# Patient Record
Sex: Male | Born: 1940
Health system: Southern US, Community
[De-identification: ages and names within clinical notes are randomized; demographics above are authoritative.]

## PROBLEM LIST (undated history)

## (undated) DIAGNOSIS — N400 Enlarged prostate without lower urinary tract symptoms: Secondary | ICD-10-CM

## (undated) DIAGNOSIS — T7840XA Allergy, unspecified, initial encounter: Secondary | ICD-10-CM

## (undated) DIAGNOSIS — N200 Calculus of kidney: Secondary | ICD-10-CM

## (undated) DIAGNOSIS — J189 Pneumonia, unspecified organism: Secondary | ICD-10-CM

## (undated) DIAGNOSIS — H269 Unspecified cataract: Secondary | ICD-10-CM

## (undated) DIAGNOSIS — Z87442 Personal history of urinary calculi: Secondary | ICD-10-CM

## (undated) DIAGNOSIS — H9190 Unspecified hearing loss, unspecified ear: Secondary | ICD-10-CM

## (undated) DIAGNOSIS — E785 Hyperlipidemia, unspecified: Secondary | ICD-10-CM

## (undated) HISTORY — DX: Allergy, unspecified, initial encounter: T78.40XA

## (undated) HISTORY — DX: Unspecified cataract: H26.9

## (undated) HISTORY — DX: Hyperlipidemia, unspecified: E78.5

## (undated) HISTORY — PX: LITHOTRIPSY: SUR834

## (undated) HISTORY — PX: TONSILLECTOMY: SUR1361

## (undated) HISTORY — PX: COLONOSCOPY: SHX174

---

## 2004-02-22 HISTORY — PX: LASER OF PROSTATE W/ GREEN LIGHT PVP: SHX1953

## 2010-11-13 ENCOUNTER — Emergency Department: Payer: Self-pay | Admitting: Emergency Medicine

## 2012-09-06 ENCOUNTER — Emergency Department: Payer: Self-pay | Admitting: Emergency Medicine

## 2012-09-06 LAB — URINALYSIS, COMPLETE
Glucose,UR: NEGATIVE mg/dL (ref 0–75)
Leukocyte Esterase: NEGATIVE
Nitrite: NEGATIVE
RBC,UR: 91 /HPF (ref 0–5)
WBC UR: 5 /HPF (ref 0–5)

## 2012-09-06 LAB — CBC WITH DIFFERENTIAL/PLATELET
Basophil #: 0 10*3/uL (ref 0.0–0.1)
Eosinophil #: 0 10*3/uL (ref 0.0–0.7)
Lymphocyte #: 0.8 10*3/uL — ABNORMAL LOW (ref 1.0–3.6)
Lymphocyte %: 10.2 %
MCH: 30.6 pg (ref 26.0–34.0)
MCV: 89 fL (ref 80–100)
Monocyte %: 9.5 %
Neutrophil #: 6.5 10*3/uL (ref 1.4–6.5)
Neutrophil %: 79.3 %
Platelet: 174 10*3/uL (ref 150–440)
RBC: 4.74 10*6/uL (ref 4.40–5.90)
RDW: 14.2 % (ref 11.5–14.5)
WBC: 8.2 10*3/uL (ref 3.8–10.6)

## 2012-09-06 LAB — COMPREHENSIVE METABOLIC PANEL
Albumin: 3.1 g/dL — ABNORMAL LOW (ref 3.4–5.0)
Alkaline Phosphatase: 105 U/L (ref 50–136)
BUN: 28 mg/dL — ABNORMAL HIGH (ref 7–18)
Bilirubin,Total: 0.5 mg/dL (ref 0.2–1.0)
Calcium, Total: 8.6 mg/dL (ref 8.5–10.1)
Chloride: 108 mmol/L — ABNORMAL HIGH (ref 98–107)
Creatinine: 1.58 mg/dL — ABNORMAL HIGH (ref 0.60–1.30)
EGFR (Non-African Amer.): 42 — ABNORMAL LOW
Osmolality: 285 (ref 275–301)
Sodium: 139 mmol/L (ref 136–145)
Total Protein: 7 g/dL (ref 6.4–8.2)

## 2012-09-06 LAB — LIPASE, BLOOD: Lipase: 54 U/L — ABNORMAL LOW (ref 73–393)

## 2012-09-24 ENCOUNTER — Ambulatory Visit: Payer: Self-pay | Admitting: Urology

## 2012-09-27 ENCOUNTER — Ambulatory Visit: Payer: Self-pay | Admitting: Urology

## 2012-10-16 ENCOUNTER — Ambulatory Visit: Payer: Self-pay | Admitting: Urology

## 2014-12-03 ENCOUNTER — Observation Stay
Admission: EM | Admit: 2014-12-03 | Discharge: 2014-12-04 | Disposition: A | Discharge status: Home / Self Care | Payer: Medicare Other | Attending: Surgery | Admitting: Surgery

## 2014-12-03 ENCOUNTER — Observation Stay: Payer: Medicare Other | Admitting: Anesthesiology

## 2014-12-03 ENCOUNTER — Encounter
Admission: EM | Disposition: A | Discharge status: Home / Self Care | Payer: Self-pay | Source: Home / Self Care | Attending: Emergency Medicine

## 2014-12-03 ENCOUNTER — Emergency Department: Payer: Medicare Other

## 2014-12-03 DIAGNOSIS — K625 Hemorrhage of anus and rectum: Secondary | ICD-10-CM | POA: Diagnosis not present

## 2014-12-03 DIAGNOSIS — Y929 Unspecified place or not applicable: Secondary | ICD-10-CM | POA: Insufficient documentation

## 2014-12-03 DIAGNOSIS — T185XXA Foreign body in anus and rectum, initial encounter: Secondary | ICD-10-CM | POA: Diagnosis present

## 2014-12-03 DIAGNOSIS — Y9389 Activity, other specified: Secondary | ICD-10-CM | POA: Insufficient documentation

## 2014-12-03 DIAGNOSIS — Z87442 Personal history of urinary calculi: Secondary | ICD-10-CM | POA: Insufficient documentation

## 2014-12-03 DIAGNOSIS — Z886 Allergy status to analgesic agent status: Secondary | ICD-10-CM | POA: Insufficient documentation

## 2014-12-03 DIAGNOSIS — E7800 Pure hypercholesterolemia, unspecified: Secondary | ICD-10-CM | POA: Insufficient documentation

## 2014-12-03 DIAGNOSIS — Z87891 Personal history of nicotine dependence: Secondary | ICD-10-CM | POA: Insufficient documentation

## 2014-12-03 DIAGNOSIS — X58XXXA Exposure to other specified factors, initial encounter: Secondary | ICD-10-CM | POA: Insufficient documentation

## 2014-12-03 HISTORY — PX: RECTAL EXAM UNDER ANESTHESIA: SHX6399

## 2014-12-03 LAB — BASIC METABOLIC PANEL
ANION GAP: 7 (ref 5–15)
BUN: 23 mg/dL — ABNORMAL HIGH (ref 6–20)
CHLORIDE: 107 mmol/L (ref 101–111)
CO2: 25 mmol/L (ref 22–32)
Calcium: 8.8 mg/dL — ABNORMAL LOW (ref 8.9–10.3)
Creatinine, Ser: 0.97 mg/dL (ref 0.61–1.24)
GFR calc non Af Amer: >60 mL/min (ref >60)
Glucose, Bld: 145 mg/dL — ABNORMAL HIGH (ref 65–99)
POTASSIUM: 4.1 mmol/L (ref 3.5–5.1)
SODIUM: 139 mmol/L (ref 135–145)

## 2014-12-03 LAB — TYPE AND SCREEN
ABO/RH(D): O POS
Antibody Screen: NEGATIVE

## 2014-12-03 LAB — CBC
HCT: 43.1 % (ref 40.0–52.0)
Hemoglobin: 14.4 g/dL (ref 13.0–18.0)
MCH: 30.3 pg (ref 26.0–34.0)
MCHC: 33.5 g/dL (ref 32.0–36.0)
MCV: 90.3 fL (ref 80.0–100.0)
Platelets: 186 10*3/uL (ref 150–440)
RBC: 4.77 MIL/uL (ref 4.40–5.90)
RDW: 14.8 % — ABNORMAL HIGH (ref 11.5–14.5)
WBC: 7.8 10*3/uL (ref 3.8–10.6)

## 2014-12-03 SURGERY — SIGMOIDOSCOPY
Anesthesia: General | Site: Rectum | Wound class: Dirty or Infected

## 2014-12-03 MED ORDER — HYDROCORTISONE ACETATE 25 MG RE SUPP
25.0000 mg | Freq: Two times a day (BID) | RECTAL | Status: DC
  Administered 2014-12-03: 25 mg via RECTAL
  Filled 2014-12-03 (×3): qty 1

## 2014-12-03 MED ORDER — FENTANYL CITRATE (PF) 100 MCG/2ML IJ SOLN: 25.0000 ug | INTRAMUSCULAR | Status: DC | PRN

## 2014-12-03 MED ORDER — ONDANSETRON HCL 4 MG PO TABS: 4.0000 mg | ORAL_TABLET | Freq: Four times a day (QID) | ORAL | Status: DC | PRN

## 2014-12-03 MED ORDER — FENTANYL CITRATE (PF) 100 MCG/2ML IJ SOLN
INTRAMUSCULAR | Status: DC | PRN
  Administered 2014-12-03: 50 ug via INTRAVENOUS
  Administered 2014-12-03: 25 ug via INTRAVENOUS

## 2014-12-03 MED ORDER — LACTATED RINGERS IV SOLN
INTRAVENOUS | Status: DC | PRN
  Administered 2014-12-03: 22:00:00 via INTRAVENOUS

## 2014-12-03 MED ORDER — MORPHINE SULFATE (PF) 4 MG/ML IV SOLN
INTRAVENOUS | Status: AC
  Administered 2014-12-03: 4 mg via INTRAVENOUS
  Filled 2014-12-03: qty 1

## 2014-12-03 MED ORDER — ONDANSETRON HCL 4 MG/2ML IJ SOLN
INTRAMUSCULAR | Status: AC
Start: 2014-12-03 — End: 2014-12-03
  Administered 2014-12-03: 4 mg via INTRAMUSCULAR
  Filled 2014-12-03: qty 2

## 2014-12-03 MED ORDER — HEPARIN SODIUM (PORCINE) 5000 UNIT/ML IJ SOLN: 5000.0000 [IU] | Freq: Three times a day (TID) | INTRAMUSCULAR | Status: DC

## 2014-12-03 MED ORDER — ONDANSETRON HCL 4 MG/2ML IJ SOLN: 4.0000 mg | Freq: Four times a day (QID) | INTRAMUSCULAR | Status: DC | PRN

## 2014-12-03 MED ORDER — STARCH 51 % RE SUPP: 1.0000 | RECTAL | Status: DC | PRN

## 2014-12-03 MED ORDER — MORPHINE SULFATE (PF) 4 MG/ML IV SOLN
4.0000 mg | Freq: Once | INTRAVENOUS | Status: AC
  Administered 2014-12-03: 4 mg via INTRAVENOUS

## 2014-12-03 MED ORDER — OXYCODONE-ACETAMINOPHEN 5-325 MG PO TABS: 1.0000 | ORAL_TABLET | Freq: Four times a day (QID) | ORAL | Status: DC | PRN

## 2014-12-03 MED ORDER — ONDANSETRON HCL 4 MG/2ML IJ SOLN: 4.0000 mg | Freq: Once | INTRAMUSCULAR | Status: DC

## 2014-12-03 MED ORDER — MORPHINE SULFATE (PF) 2 MG/ML IV SOLN
2.0000 mg | INTRAVENOUS | Status: DC | PRN
  Administered 2014-12-03: 2 mg via INTRAVENOUS
  Filled 2014-12-03: qty 1

## 2014-12-03 MED ORDER — ONDANSETRON HCL 4 MG/2ML IJ SOLN
4.0000 mg | Freq: Once | INTRAMUSCULAR | Status: AC
  Administered 2014-12-03: 4 mg via INTRAMUSCULAR

## 2014-12-03 MED ORDER — ONDANSETRON HCL 4 MG/2ML IJ SOLN: 4.0000 mg | Freq: Once | INTRAMUSCULAR | Status: DC | PRN

## 2014-12-03 MED ORDER — PROPOFOL 10 MG/ML IV BOLUS
INTRAVENOUS | Status: DC | PRN
  Administered 2014-12-03: 200 mg via INTRAVENOUS

## 2014-12-03 SURGICAL SUPPLY — 20 items
COVER LIGHT HANDLE STERIS (MISCELLANEOUS) ×8 IMPLANT
COVER MAYO STAND STRL (DRAPES) ×4 IMPLANT
DRAPE LAPAROTOMY 100X77 ABD (DRAPES) ×4 IMPLANT
DRAPE LEGGINS SURG 28X43 STRL (DRAPES) ×4 IMPLANT
DRAPE SHEET LG 3/4 BI-LAMINATE (DRAPES) ×4 IMPLANT
DRAPE TABLE BACK 80X90 (DRAPES) ×4 IMPLANT
GLOVE INDICATOR 8.0 STRL GRN (GLOVE) ×4 IMPLANT
GOWN STRL REUS W/ TWL LRG LVL3 (GOWN DISPOSABLE) ×4 IMPLANT
GOWN STRL REUS W/TWL LRG LVL3 (GOWN DISPOSABLE) ×4
HANDLE YANKAUER SUCT BULB TIP (MISCELLANEOUS) ×4 IMPLANT
KIT RM TURNOVER CYSTO AR (KITS) ×4 IMPLANT
LABEL OR SOLS (LABEL) ×4 IMPLANT
PAD OB MATERNITY 4.3X12.25 (PERSONAL CARE ITEMS) ×4 IMPLANT
SOL PREP PVP 2OZ (MISCELLANEOUS) ×4
SOLUTION PREP PVP 2OZ (MISCELLANEOUS) ×2 IMPLANT
SPONGE XRAY 4X4 16PLY STRL (MISCELLANEOUS) ×4 IMPLANT
SURGILUBE 2OZ TUBE FLIPTOP (MISCELLANEOUS) ×4 IMPLANT
TOWEL OR 17X26 4PK STRL BLUE (TOWEL DISPOSABLE) ×4 IMPLANT
TUBING CONNECTING 10 (TUBING) ×3 IMPLANT
TUBING CONNECTING 10' (TUBING) ×1

## 2014-12-03 NOTE — ED Notes (Signed)
Patient transported to X-ray 

## 2014-12-03 NOTE — H&P (Signed)
Colin SchaumannMark L Smith is an 74 y.o. male.    Chief Complaint: Rectal foreign body  HPI: This a patient who self inserted a rectal foreign body, a dry gourd, around 4:30 this afternoon. He has done this before in fact he had have a glass apple removed from his rectum under anesthesia a 17 years ago. Currently he is experiencing some minor bleeding and pain. No nausea or vomiting. He lives at home with his wife and his wife is in the emergency room waiting area.  Past Medical History  Diagnosis Date  . Kidney stone     History reviewed. No pertinent past surgical history.  History reviewed. No pertinent family history. Social History:  reports that he has quit smoking. He does not have any smokeless tobacco history on file. He reports that he does not drink alcohol. His drug history is not on file.  Allergies:  Allergies  Allergen Reactions  . Ibuprofen Anaphylaxis and Swelling     (Not in a hospital admission)   Review of Systems  Constitutional: Negative.   HENT: Negative.   Eyes: Negative.   Respiratory: Negative.   Cardiovascular: Negative.   Gastrointestinal: Negative.  Negative for vomiting and abdominal pain.  Genitourinary: Negative.   Musculoskeletal: Negative.   Skin: Negative.   Neurological: Negative.   Endo/Heme/Allergies: Negative.   Psychiatric/Behavioral: Negative.      Physical Exam:  BP 184/91 mmHg  Pulse 72  Temp(Src) 98.2 F (36.8 C) (Oral)  Resp 18  Ht 5\' 6"  (1.676 m)  Wt 185 lb (83.915 kg)  BMI 29.87 kg/m2  SpO2 95%  Physical Exam  Constitutional: He is oriented to person, place, and time and well-developed, well-nourished, and in no distress. No distress.  In no acute distress but embarrassed  HENT:  Head: Normocephalic.  Eyes: Pupils are equal, round, and reactive to light. Right eye exhibits no discharge. Left eye exhibits no discharge. No scleral icterus.  Neck: Normal range of motion. Neck supple.  Cardiovascular: Normal rate, regular  rhythm and normal heart sounds.   Pulmonary/Chest: Effort normal and breath sounds normal. No respiratory distress. He has no wheezes.  Abdominal: Soft. He exhibits no distension. There is no tenderness.  Genitourinary:  Rectal exam not performed. Examination under anesthesia will be necessary and therefore GU and rectal exam will be performed at that time.  The emergency room physician described palpable shards of gourd present  Musculoskeletal: He exhibits no edema.  Lymphadenopathy:    He has no cervical adenopathy.  Neurological: He is alert and oriented to person, place, and time.  Skin: Skin is warm and dry.  Psychiatric: Affect normal.  Vitals reviewed.       Results for orders placed or performed during the hospital encounter of 12/03/14 (from the past 48 hour(s))  CBC     Status: Abnormal   Collection Time: 12/03/14  6:19 PM  Result Value Ref Range   WBC 7.8 3.8 - 10.6 K/uL   RBC 4.77 4.40 - 5.90 MIL/uL   Hemoglobin 14.4 13.0 - 18.0 g/dL   HCT 16.143.1 09.640.0 - 04.552.0 %   MCV 90.3 80.0 - 100.0 fL   MCH 30.3 26.0 - 34.0 pg   MCHC 33.5 32.0 - 36.0 g/dL   RDW 40.914.8 (H) 81.111.5 - 91.414.5 %   Platelets 186 150 - 440 K/uL   Dg Abd 1 View  12/03/2014  CLINICAL DATA:  Rectal pain, bleeding today. EXAM: ABDOMEN - 1 VIEW COMPARISON:  10/16/2012 FINDINGS: Nonobstructive bowel gas  pattern. Gas noted within a mildly prominent rectum. No free air organomegaly. No suspicious calcification. No acute bony abnormality. IMPRESSION: No acute findings. Electronically Signed   By: Charlett Nose M.D.   On: 12/03/2014 18:52     Assessment/Plan  Rectal foreign body present since 4:30 this afternoon. It is a dried cord which is broken. He's has experienced some bleeding and some pain and requires examination under anesthesia and extraction likely utilizing a rigid sigmoidoscope. Her graft the rationale for this been discussed with him the risk of bleeding infection recurrence and inability to remove all of  the broken pieces were discussed with him he understood and agreed to proceed.  Also discussed with him the timing of surgery in that other surgeries are scheduled tonight and that he will be placed in order appropriately. He did not seem to be happy with this and did not want to stay overnight. I reminded him of Y would keep him overnight due to general anesthesia related night as well as possible bleeding they cannot be assessed preoperatively.  Lattie Haw, MD, FACS

## 2014-12-03 NOTE — Anesthesia Preprocedure Evaluation (Signed)
Anesthesia Evaluation  Patient identified by MRN, date of birth, ID band Patient awake    Reviewed: Allergy & Precautions, NPO status , Patient's Chart, lab work & pertinent test results  History of Anesthesia Complications Negative for: history of anesthetic complications  Airway Mallampati: II       Dental  (+) Missing   Pulmonary neg pulmonary ROS, former smoker,           Cardiovascular negative cardio ROS       Neuro/Psych negative neurological ROS     GI/Hepatic negative GI ROS, Neg liver ROS,   Endo/Other  negative endocrine ROS  Renal/GU Renal disease (stones)     Musculoskeletal   Abdominal   Peds  Hematology negative hematology ROS (+)   Anesthesia Other Findings   Reproductive/Obstetrics                             Anesthesia Physical Anesthesia Plan  ASA: II and emergent  Anesthesia Plan: General   Post-op Pain Management:    Induction: Intravenous  Airway Management Planned: LMA  Additional Equipment:   Intra-op Plan:   Post-operative Plan:   Informed Consent: I have reviewed the patients History and Physical, chart, labs and discussed the procedure including the risks, benefits and alternatives for the proposed anesthesia with the patient or authorized representative who has indicated his/her understanding and acceptance.     Plan Discussed with:   Anesthesia Plan Comments:         Anesthesia Quick Evaluation

## 2014-12-03 NOTE — Transfer of Care (Signed)
Immediate Anesthesia Transfer of Care Note  Patient: Colin SchaumannMark L Smith  Procedure(s) Performed: Procedure(s): SIGMOIDOSCOPY (N/A) RECTAL EXAM UNDER ANESTHESIA  Patient Location: PACU  Anesthesia Type:General  Level of Consciousness: awake and patient cooperative  Airway & Oxygen Therapy: Patient Spontanous Breathing  Post-op Assessment: Report given to RN and Post -op Vital signs reviewed and stable  Post vital signs: Reviewed and stable  Last Vitals:  Filed Vitals:   12/03/14 2029  BP: 169/97  Pulse: 59  Temp: 36.8 C  Resp: 18    Complications: No apparent anesthesia complications

## 2014-12-03 NOTE — ED Notes (Signed)
Spoke to Dr. Pershing ProudSchaevitz in reguards to patient chief complaint.  Verbal orders recieved.

## 2014-12-03 NOTE — Op Note (Signed)
12/03/2014  10:19 PM  PATIENT:  Colin Smith  74 y.o. male  PRE-OPERATIVE DIAGNOSIS:  Rectal foreign body  POST-OPERATIVE DIAGNOSIS:  Same  PROCEDURE: Examination under anesthesia, retrieval of foreign body, rigid sigmoidoscopy  SURGEON:  Lattie Hawichard E Cooper MD, FACS   ANESTHESIA:  Gen. with LMA   Details of Procedure: This a patient with a history of a rectal foreign body believed to be a dried gourd which has fragmented. Preoperatively we discussed rationale for examination under anesthesia and retrieval and sigmoidoscopy the risks of bleeding and infection and inability to retrieve all the fragments was discussed with him he understood and agreed to proceed no family was present for this discussion.  Patient was identified and brought to the operating room placed under general anesthesia and then into a high lithotomy position. A surgical positive was performed then a digital rectal exam was performed. She had grade 4 hemorrhoids and a palpable large foreign body in the rectal vault it was smooth but had sharp edges on it facing the anus. An attempt at grasping it was futile therefore a GYN tenaculum was utilized to grasp the edges. In so doing it fragmented further into smaller pieces which were retrieved and sent for examination. Ultimately S Ring with a tenaculum and gentle traction and manipulation with finger luxation was performed and allowed for retrieval of the remainder of thegourd intact.  Following removal a follow-up rectal exam was performed digitally and then the sigmoidoscope was placed into the anal vault was minimal bleeding but no active bleeding present on visual inspection. A complete sigmoidoscopy was not performed.  Patient tolerated this procedure well there were no complications he was taken down from lithotomy position and awakened and placed into the hospital bed he was taken recovery room in stable condition to be admitted for observation   Lattie Hawichard E Cooper, MD  FACS

## 2014-12-03 NOTE — Anesthesia Procedure Notes (Signed)
Procedure Name: LMA Insertion Date/Time: 12/03/2014 9:52 PM Performed by: Waldo LaineJUSTIS, Lavi Sheehan Pre-anesthesia Checklist: Patient identified, Emergency Drugs available, Suction available, Patient being monitored and Timeout performed Patient Re-evaluated:Patient Re-evaluated prior to inductionOxygen Delivery Method: Circle system utilized Preoxygenation: Pre-oxygenation with 100% oxygen Intubation Type: IV induction Ventilation: Mask ventilation with difficulty LMA: LMA inserted LMA Size: 4.5

## 2014-12-03 NOTE — Anesthesia Postprocedure Evaluation (Signed)
  Anesthesia Post-op Note  Patient: Arnette SchaumannMark L Minter  Procedure(s) Performed: Procedure(s): SIGMOIDOSCOPY (N/A) RECTAL EXAM UNDER ANESTHESIA  Anesthesia type:General  Patient location: PACU  Post pain: Pain level controlled  Post assessment: Post-op Vital signs reviewed, Patient's Cardiovascular Status Stable, Respiratory Function Stable, Patent Airway and No signs of Nausea or vomiting  Post vital signs: Reviewed and stable  Last Vitals:  Filed Vitals:   12/03/14 2306  BP: 133/60  Pulse: 64  Temp: 36.4 C  Resp: 16    Level of consciousness: awake, alert  and patient cooperative  Complications: No apparent anesthesia complications

## 2014-12-03 NOTE — Discharge Instructions (Signed)
Use suppository twice a day for 5 days Resume normal activity

## 2014-12-03 NOTE — ED Provider Notes (Signed)
[facility] Emergency Department Provider Note  ____________________________________________  Time seen: Approximately 630 PM  I have reviewed the triage vital signs and the nursing notes.   HISTORY  Chief Complaint Trauma   HPI Colin Smith is a 74 y.o. male with a history of high cholesterol who is presenting today after inserting a gourd into his rectum. He said that he inserted the court about 4:00 this afternoon and then the end of it broke leaving a large portion of the Halley in his rectum. He said that this was a dried gourd and likely has sharp edges. He has had a small amount of bright red bleeding from his rectum since this incident. He is experiencing moderate pain at this time to his rectum. He said that he inserted a glass apple about 17 years ago as well.   Past Medical History  Diagnosis Date  . Kidney stone     There are no active problems to display for this patient.   History reviewed. No pertinent past surgical history.  Current Outpatient Rx  Name  Route  Sig  Dispense  Refill  . rosuvastatin (CRESTOR) 10 MG tablet   Oral   Take 10 mg by mouth daily.           Allergies Ibuprofen  History reviewed. No pertinent family history.  Social History Social History  Substance Use Topics  . Smoking status: Former Games developer  . Smokeless tobacco: None  . Alcohol Use: No    Review of Systems Constitutional: No fever/chills Eyes: No visual changes. ENT: No sore throat. Cardiovascular: Denies chest pain. Respiratory: Denies shortness of breath. Gastrointestinal: No abdominal pain.  No nausea, no vomiting.  No diarrhea.  No constipation. Genitourinary: Negative for dysuria. Musculoskeletal: Negative for back pain. Skin: Negative for rash. Neurological: Negative for headaches, focal weakness or numbness.  10-point ROS otherwise negative.  ____________________________________________   PHYSICAL EXAM:  VITAL SIGNS: ED  Triage Vitals  Enc Vitals Group     BP 12/03/14 1812 184/91 mmHg     Pulse Rate 12/03/14 1812 72     Resp 12/03/14 1812 18     Temp 12/03/14 1812 98.2 F (36.8 C)     Temp Source 12/03/14 1812 Oral     SpO2 12/03/14 1812 95 %     Weight 12/03/14 1812 185 lb (83.915 kg)     Height 12/03/14 1812  (1.676 m)     Head Cir --      Peak Flow --      Pain Score --      Pain Loc --      Pain Edu? --      Excl. in GC? --     Constitutional: Alert and oriented. Well appearing and in no acute distress. Eyes: Conjunctivae are normal. PERRL. EOMI. Head: Atraumatic. Nose: No congestion/rhinnorhea. Mouth/Throat: Mucous membranes are moist.   Neck: No stridor.   Cardiovascular: Normal rate, regular rhythm. Grossly normal heart sounds.  Good peripheral circulation. Respiratory: Normal respiratory effort.  No retractions. Lungs CTAB. Gastrointestinal: Soft and nontender. No distention. No abdominal bruits. No CVA tenderness. Rectal exam with multiple non-engorged hemorrhoids. He does have a small amount of bright red blood on his underwear. Rectal exam with palpable sharp edges of the gourd. Musculoskeletal: No lower extremity tenderness nor edema.  No joint effusions. Neurologic:  Normal speech and language. No gross focal neurologic deficits are appreciated. No gait instability. Skin:  Skin is warm, dry and intact.  No rash noted. Psychiatric: Mood and affect are normal. Speech and behavior are normal.  ____________________________________________   LABS (all labs ordered are listed, but only abnormal results are displayed)  Labs Reviewed  CBC  BASIC METABOLIC PANEL  TYPE AND SCREEN   ____________________________________________  EKG   ____________________________________________  RADIOLOGY  Round, air filled structure in the rectum with jagged edge likely representing the foreign body on the x-ray of the  abdomen. ____________________________________________   PROCEDURES    ____________________________________________   INITIAL IMPRESSION / ASSESSMENT AND PLAN / ED COURSE  Pertinent labs & imaging results that were available during my care of the patient were reviewed by me and considered in my medical decision making (see chart for details).  ----------------------------------------- 7:20 PM on 12/03/2014 -----------------------------------------  Called Dr. Excell Seltzerooper because I believe the patient, in order to have this objects safely removed, will need a scope. Dr. Excell Seltzerooper did evaluate the patient and will be taking the patient to the operating room later tonight when there is a room available. Patient will be given pain meds and admitted to the hospital. ____________________________________________   FINAL CLINICAL IMPRESSION(S) / ED DIAGNOSES  Rectal foreign body.    Myrna Blazeravid Matthew Shedric Fredericks, MD 12/03/14 Norberta Keens1921

## 2014-12-04 ENCOUNTER — Encounter: Payer: Self-pay | Admitting: Surgery

## 2014-12-04 LAB — ABO/RH: ABO/RH(D): O POS

## 2014-12-04 MED ORDER — OXYCODONE-ACETAMINOPHEN 5-325 MG PO TABS: 1.0000 | ORAL_TABLET | Freq: Four times a day (QID) | ORAL | Status: DC | PRN

## 2014-12-04 MED ORDER — HYDROCORTISONE ACETATE 25 MG RE SUPP: 25.0000 mg | Freq: Two times a day (BID) | RECTAL | Status: DC

## 2014-12-04 NOTE — Progress Notes (Signed)
Pt voided 100 ml.  Colin Smith

## 2014-12-04 NOTE — Progress Notes (Signed)
Patient discharge teaching given, including activity, diet, follow-up appoints, and medications. Patient verbalized understanding of all discharge instructions. IV access was d/c'd. Vitals are stable. Skin is intact except as charted in most recent assessments. Pt to be escorted out by NT, to be driven home by family.  Emre Stock E Hobbs  

## 2014-12-04 NOTE — Progress Notes (Signed)
Pt admitted to [facility] from ED.  Went to OR for removal of gourd from the rectum per Dr. Excell Seltzerooper. VSS at this time.  No c/o pain. No n/v noted. Foley in place draining clear yellow urine.  Will remove foley at 6am as ordered. Resting quietly at this time.  No bleeding noted.  Will cont. To monitor.

## 2014-12-05 LAB — SURGICAL PATHOLOGY

## 2014-12-10 ENCOUNTER — Ambulatory Visit: Payer: Medicare Other | Admitting: Surgery

## 2016-02-29 ENCOUNTER — Ambulatory Visit (INDEPENDENT_AMBULATORY_CARE_PROVIDER_SITE_OTHER): Payer: Medicare Other | Admitting: Cardiovascular Disease

## 2016-02-29 ENCOUNTER — Encounter: Payer: Self-pay | Admitting: Cardiovascular Disease

## 2016-02-29 ENCOUNTER — Encounter (INDEPENDENT_AMBULATORY_CARE_PROVIDER_SITE_OTHER): Payer: Self-pay

## 2016-02-29 VITALS — BP 140/80 | HR 77 | Ht 66.5 in | Wt 197.8 lb

## 2016-02-29 DIAGNOSIS — M7989 Other specified soft tissue disorders: Secondary | ICD-10-CM

## 2016-02-29 DIAGNOSIS — Z0000 Encounter for general adult medical examination without abnormal findings: Secondary | ICD-10-CM

## 2016-02-29 DIAGNOSIS — Z87891 Personal history of nicotine dependence: Secondary | ICD-10-CM | POA: Diagnosis not present

## 2016-02-29 DIAGNOSIS — E782 Mixed hyperlipidemia: Secondary | ICD-10-CM | POA: Diagnosis not present

## 2016-02-29 DIAGNOSIS — I10 Essential (primary) hypertension: Secondary | ICD-10-CM | POA: Diagnosis not present

## 2016-02-29 NOTE — Patient Instructions (Addendum)
Medication Instructions:   No medication changes made  Please monitor your edema, Call if you would like a different diuretic We would use lasix/furosemide as needed Watch the sodium   Labwork:  No new labs needed  Testing/Procedures:  No further testing at this time  Consider a CT coronary calcium score   I recommend watching educational videos on topics of interest to you at:       www.goemmi.com  Enter code: HEARTCARE    Follow-Up: It was a pleasure seeing you in the office today. Please call us if you have new issues that need to be addressed before your next appt.  [phone]  Your physician wants you to follow-up in: as needed  If you need a refill on your cardiac medications before your next appointment, please call your pharmacy.

## 2016-02-29 NOTE — Progress Notes (Signed)
Cardiology Office Note  Date:  02/29/2016   ID:  Colin SchaumannMark L Smith, DOB 07/20/1940, MRN [id number]  PCP:  Colin Smith, JOHN B, MD   Chief Complaint  Patient presents with  . other     New Patient. wants to establish with Cardiologist for LE edema. Pt c/o bilateral ankle/hand swelling; usually ankles. Reviewed meds with pt verbally.    HPI:  [name] is a 76 year old gentleman with history of hyperlipidemia who presents by self-referral for consultation of his lower extremity edema. Patient's wife has been seen in our clinic before Notes from primary care indicate chronic cough Patient reports prior smoking history, stopped many years ago  He reports that over the summer he noticed some leg swelling Fluid seem to collect around his ankles, seem to get better with activity such as playing tennis. symptoms seem to wax and wane, now getting worse    he reports that he Saw Dr. Dan HumphreysWalker,  primary care physician, who started  himon HCTZ 12.5 daily. He took this for a week or so and developed severe joint pain  He stopped the medication approximately 2 weeks ago symptoms resolved  On today's visit he reports that his edema has resolved   did not seem to come back in the past 2 weeks Wife thinks he drinks a lot He does like to sometimes have salty foods such as follows  Recent lab work discussed with him showing normal CBC, his metabolic panel Total cholesterol 170s    PMH:   has a past medical history of Kidney stone.  PSH:    Past Surgical History:  Procedure Laterality Date  . RECTAL EXAM UNDER ANESTHESIA  12/03/2014   Procedure: RECTAL EXAM UNDER ANESTHESIA;  Surgeon: Lattie Hawichard E Cooper, MD;  Location: [facility] ORS;  Service: General;;    Current Outpatient Prescriptions  Medication Sig Dispense Refill  . acetaminophen (TYLENOL) 500 MG tablet Take 1,000 mg by mouth every 6 (six) hours as needed for mild pain.    . Ascorbic Acid (VITAMIN C) 1000 MG tablet Take 1,000 mg by mouth daily.    .  Cholecalciferol (VITAMIN D3) 3000 UNITS TABS Take 3,000 Units by mouth every other day.    . Omega-3 Fatty Acids (FISH OIL) 1000 MG CAPS Take 1,000 mg by mouth daily.    . rosuvastatin (CRESTOR) 10 MG tablet Take 10 mg by mouth daily.    . vitamin E 400 UNIT capsule Take 400 Units by mouth daily.     No current facility-administered medications for this visit.      Allergies:   Ibuprofen   Social History:  The patient  reports that he has quit smoking. He has never used smokeless tobacco. He reports that he drinks about 0.6 oz of alcohol per week . He reports that he does not use drugs.   Family History:   family history includes Arthritis/Rheumatoid in his mother; Diabetes in his brother and maternal grandmother; Hypertension in his father, mother, and paternal grandmother; Prostate cancer in his father.    Review of Systems: Review of Systems  Constitutional: Negative.   Respiratory: Negative.   Cardiovascular: Positive for leg swelling.  Gastrointestinal: Negative.   Musculoskeletal: Negative.   Neurological: Negative.   Psychiatric/Behavioral: Negative.   All other systems reviewed and are negative.    PHYSICAL EXAM: VS:  BP 140/80 (BP Location: Right Arm, Patient Position: Sitting, Cuff Size: Normal)   Pulse 77   Ht 5' 6.5" (1.689 m)   Wt 197 lb  12 oz (89.7 kg)   BMI 31.44 kg/m  , BMI Body mass index is 31.44 kg/m. GEN: Well nourished, well developed, in no acute distress  HEENT: normal  Neck: no JVD, carotid bruits, or masses Cardiac: RRR; no murmurs, rubs, or gallops,no edema  Respiratory:  clear to auscultation bilaterally, normal work of breathing GI: soft, nontender, nondistended, + BS MS: no deformity or atrophy  Skin: warm and dry, no rash Neuro:  Strength and sensation are intact Psych: euthymic mood, full affect    Recent Labs: No results found for requested labs within last 8760 hours.    Lipid Panel No results found for: CHOL, HDL, LDLCALC,  TRIG    Wt Readings from Last 3 Encounters:  02/29/16 197 lb 12 oz (89.7 kg)  12/03/14 182 lb (82.6 kg)       ASSESSMENT AND PLAN:  Localized swelling of lower extremity On today's visit swelling has resolved Long discussion concerning various types of swelling including diastolic CHF, venous insufficiency, lymphedema.  Potentially may have had component of diastolic CHF in the setting of high salt intake resolved with HCTZ. Recommended if symptoms represent that he call our office If needed could try alternate diuretic if HCTZ is giving him side effects Would likely not need this on a regular basis, only sparingly  Mixed hyperlipidemia Managed by primary care, on Crestor No known coronary artery disease Recent climb in lipids over the past year likely from weight gain  Essential hypertension Blood pressure elevated on today's visit. Recommended she closely monitor blood pressure at home and call our office with numbers If blood pressure runs high, may need additional medication  Smoking history currently not smoking, stopped many years ago   Encounter for health maintenance exam Discussed various surveillance testing such as CT coronary calcium score His wife had this as screening test, he does not want to do the test at this time   Total encounter time more than 45 minutes  Greater than 50% was spent in counseling and coordination of care with the patient   Disposition:   F/U  as needed   No orders of the defined types were placed in this encounter.    Signed, Dossie Arbour, M.D., Ph.D. 02/29/2016  [facility] [place], [place] [phone]

## 2016-03-02 NOTE — Addendum Note (Signed)
Addended by: Juleen StarrBRACKETT, BRITTANY L on: 03/02/2016 01:57 PM   Modules accepted: Orders

## 2016-04-19 ENCOUNTER — Other Ambulatory Visit: Payer: Self-pay | Admitting: Internal Medicine

## 2016-04-19 DIAGNOSIS — N5089 Other specified disorders of the male genital organs: Secondary | ICD-10-CM

## 2016-04-22 ENCOUNTER — Ambulatory Visit
Admission: RE | Admit: 2016-04-22 | Discharge: 2016-04-22 | Disposition: A | Discharge status: Home / Self Care | Payer: Medicare Other | Source: Ambulatory Visit | Attending: Internal Medicine | Admitting: Internal Medicine

## 2016-04-22 DIAGNOSIS — N5089 Other specified disorders of the male genital organs: Secondary | ICD-10-CM

## 2016-04-22 DIAGNOSIS — N509 Disorder of male genital organs, unspecified: Secondary | ICD-10-CM | POA: Diagnosis present

## 2016-04-22 DIAGNOSIS — N503 Cyst of epididymis: Secondary | ICD-10-CM | POA: Diagnosis not present

## 2016-05-25 ENCOUNTER — Other Ambulatory Visit: Payer: Self-pay | Admitting: Student

## 2016-05-25 DIAGNOSIS — M7521 Bicipital tendinitis, right shoulder: Secondary | ICD-10-CM

## 2016-06-06 ENCOUNTER — Ambulatory Visit
Admission: RE | Admit: 2016-06-06 | Discharge: 2016-06-06 | Disposition: A | Discharge status: Home / Self Care | Payer: Medicare Other | Source: Ambulatory Visit | Attending: Student | Admitting: Student

## 2016-06-06 DIAGNOSIS — M19011 Primary osteoarthritis, right shoulder: Secondary | ICD-10-CM | POA: Insufficient documentation

## 2016-06-06 DIAGNOSIS — M75101 Unspecified rotator cuff tear or rupture of right shoulder, not specified as traumatic: Secondary | ICD-10-CM | POA: Insufficient documentation

## 2016-06-06 DIAGNOSIS — M7551 Bursitis of right shoulder: Secondary | ICD-10-CM | POA: Insufficient documentation

## 2016-06-06 DIAGNOSIS — M7521 Bicipital tendinitis, right shoulder: Secondary | ICD-10-CM | POA: Diagnosis present

## 2016-06-06 DIAGNOSIS — M75111 Incomplete rotator cuff tear or rupture of right shoulder, not specified as traumatic: Secondary | ICD-10-CM | POA: Diagnosis not present

## 2016-06-23 ENCOUNTER — Encounter
Admission: RE | Admit: 2016-06-23 | Discharge: 2016-06-23 | Disposition: A | Discharge status: Home / Self Care | Payer: Medicare Other | Source: Ambulatory Visit | Attending: Surgery | Admitting: Surgery

## 2016-06-23 DIAGNOSIS — Z01818 Encounter for other preprocedural examination: Secondary | ICD-10-CM | POA: Diagnosis present

## 2016-06-23 HISTORY — DX: Unspecified hearing loss, unspecified ear: H91.90

## 2016-06-23 HISTORY — DX: Personal history of urinary calculi: Z87.442

## 2016-06-23 LAB — CBC
HEMATOCRIT: 44.2 % (ref 40.0–52.0)
Hemoglobin: 15 g/dL (ref 13.0–18.0)
MCH: 30.4 pg (ref 26.0–34.0)
MCHC: 34 g/dL (ref 32.0–36.0)
MCV: 89.5 fL (ref 80.0–100.0)
PLATELETS: 208 10*3/uL (ref 150–440)
RBC: 4.94 MIL/uL (ref 4.40–5.90)
RDW: 14.8 % — ABNORMAL HIGH (ref 11.5–14.5)
WBC: 6.4 10*3/uL (ref 3.8–10.6)

## 2016-06-23 NOTE — Patient Instructions (Signed)
Your procedure is scheduled on: Jul 07, 2016 (Thursday)  Report to Same Day Surgery 2nd floor medical mall [facility](Medical Mall Entrance-take elevator on left to 2nd floor.  Check in with surgery information desk.) To find out your arrival time please call [phone] between 1PM - 3PM on Jul 06, 2016 (Wednesday)   Remember: Instructions that are not followed completely may result in serious medical risk, up to and including death, or upon the discretion of your surgeon and anesthesiologist your surgery may need to be rescheduled.    _x___ 1. Do not eat food or drink liquids after midnight. No gum chewing or hard candies                          __x__ 2. No Alcohol for 24 hours before or after surgery.   __x__3. No Smoking for 24 prior to surgery.   ____  4. Bring all medications with you on the day of surgery if instructed.    __x__ 5. Notify your doctor if there is any change in your medical condition     (cold, fever, infections).     Do not wear jewelry, make-up, hairpins, clips or nail polish.  Do not wear lotions, powders, or perfumes.   Do not shave 48 hours prior to surgery. Men may shave face and neck.  Do not bring valuables to the hospital.    [facility] is not responsible for any belongings or valuables.               Contacts, dentures or bridgework may not be worn into surgery.  Leave your suitcase in the car. After surgery it may be brought to your room.  For patients admitted to the hospital, discharge time is determined by your treatment team                        Patients discharged the day of surgery will not be allowed to drive home.  You will need someone to drive you home and stay with you the night of your procedure.    Please read over the following fact sheets that you were given:   [facility] Preparing for [facility] and or MRSA Information   _x___ Take anti-hypertensive (unless it includes a diuretic), cardiac, seizure, asthma,     anti-reflux and  psychiatric medicines with a sip of water. These include:  1. CRESTOR   2.  3.  4.  5.  6.  ____Fleets enema or Magnesium Citrate as directed.   _x___ Use CHG Soap or sage wipes as directed on instruction sheet   ____ Use inhalers on the day of surgery and bring to hospital day of surgery  ____ Stop Metformin and Janumet 2 days prior to surgery.    ____ Take 1/2 of usual insulin dose the night before surgery and none on the morning     surgery.   _x___ Follow recommendations from Cardiologist, Pulmonologist or PCP regarding          stopping Aspirin, Coumadin, Pllavix ,Eliquis, Effient, or Pradaxa, and Pletal.  X____Stop Anti-inflammatories such as Advil, Aleve, Ibuprofen, Motrin, Naproxen, Naprosyn, Goodies powders or aspirin products. OK to take Tylenol                           _x___ Stop supplements until after surgery.  But may continue Vitamin D, Vitamin B, and multivitamin. (STOP  VITAMIN C, VITAMIN E, AND OMEGA 3,FISH OIL NOW)       ____ Bring C-Pap to the hospital.

## 2016-07-06 MED ORDER — CEFAZOLIN SODIUM-DEXTROSE 2-4 GM/100ML-% IV SOLN
2.0000 g | Freq: Once | INTRAVENOUS | Status: AC
  Administered 2016-07-07: 2 g via INTRAVENOUS

## 2016-07-07 ENCOUNTER — Ambulatory Visit
Admission: RE | Admit: 2016-07-07 | Discharge: 2016-07-07 | Disposition: A | Discharge status: Home / Self Care | Payer: Medicare Other | Source: Ambulatory Visit | Attending: Surgery | Admitting: Surgery

## 2016-07-07 ENCOUNTER — Encounter
Admission: RE | Disposition: A | Discharge status: Home / Self Care | Payer: Self-pay | Source: Ambulatory Visit | Attending: Surgery

## 2016-07-07 ENCOUNTER — Ambulatory Visit: Payer: Medicare Other | Admitting: Anesthesiology

## 2016-07-07 DIAGNOSIS — Z8249 Family history of ischemic heart disease and other diseases of the circulatory system: Secondary | ICD-10-CM | POA: Insufficient documentation

## 2016-07-07 DIAGNOSIS — Z79899 Other long term (current) drug therapy: Secondary | ICD-10-CM | POA: Diagnosis not present

## 2016-07-07 DIAGNOSIS — M7521 Bicipital tendinitis, right shoulder: Secondary | ICD-10-CM | POA: Diagnosis not present

## 2016-07-07 DIAGNOSIS — M75111 Incomplete rotator cuff tear or rupture of right shoulder, not specified as traumatic: Secondary | ICD-10-CM | POA: Diagnosis present

## 2016-07-07 DIAGNOSIS — M7501 Adhesive capsulitis of right shoulder: Secondary | ICD-10-CM | POA: Diagnosis not present

## 2016-07-07 DIAGNOSIS — Z809 Family history of malignant neoplasm, unspecified: Secondary | ICD-10-CM | POA: Diagnosis not present

## 2016-07-07 DIAGNOSIS — M1990 Unspecified osteoarthritis, unspecified site: Secondary | ICD-10-CM | POA: Insufficient documentation

## 2016-07-07 DIAGNOSIS — I10 Essential (primary) hypertension: Secondary | ICD-10-CM | POA: Insufficient documentation

## 2016-07-07 DIAGNOSIS — Z87891 Personal history of nicotine dependence: Secondary | ICD-10-CM | POA: Insufficient documentation

## 2016-07-07 DIAGNOSIS — Z833 Family history of diabetes mellitus: Secondary | ICD-10-CM | POA: Insufficient documentation

## 2016-07-07 HISTORY — PX: SHOULDER ARTHROSCOPY WITH OPEN ROTATOR CUFF REPAIR: SHX6092

## 2016-07-07 HISTORY — PX: SHOULDER ARTHROSCOPY WITH SUBACROMIAL DECOMPRESSION, ROTATOR CUFF REPAIR AND BICEP TENDON REPAIR: SHX5687

## 2016-07-07 HISTORY — PX: SHOULDER ARTHROSCOPY WITH DISTAL CLAVICLE RESECTION: SHX5675

## 2016-07-07 HISTORY — PX: EXAM UNDER ANESTHESIA WITH MANIPULATION OF SHOULDER: SHX5817

## 2016-07-07 SURGERY — ARTHROSCOPY, SHOULDER WITH REPAIR, ROTATOR CUFF, OPEN
Anesthesia: General | Site: Shoulder | Laterality: Right

## 2016-07-07 MED ORDER — PHENYLEPHRINE HCL 10 MG/ML IJ SOLN
INTRAMUSCULAR | Status: AC
  Filled 2016-07-07: qty 1

## 2016-07-07 MED ORDER — BUPIVACAINE-EPINEPHRINE 0.5% -1:200000 IJ SOLN
INTRAMUSCULAR | Status: DC | PRN
  Administered 2016-07-07: 30 mL

## 2016-07-07 MED ORDER — FENTANYL CITRATE (PF) 100 MCG/2ML IJ SOLN
50.0000 ug | Freq: Once | INTRAMUSCULAR | Status: AC
  Administered 2016-07-07: 50 ug via INTRAVENOUS

## 2016-07-07 MED ORDER — BUPIVACAINE-EPINEPHRINE (PF) 0.5% -1:200000 IJ SOLN
INTRAMUSCULAR | Status: AC
  Filled 2016-07-07: qty 30

## 2016-07-07 MED ORDER — FENTANYL CITRATE (PF) 100 MCG/2ML IJ SOLN
INTRAMUSCULAR | Status: AC
  Filled 2016-07-07: qty 2

## 2016-07-07 MED ORDER — ACETAMINOPHEN 10 MG/ML IV SOLN
INTRAVENOUS | Status: DC | PRN
  Administered 2016-07-07: 1000 mg via INTRAVENOUS

## 2016-07-07 MED ORDER — EPINEPHRINE PF 1 MG/ML IJ SOLN
INTRAMUSCULAR | Status: AC
  Filled 2016-07-07: qty 2

## 2016-07-07 MED ORDER — MIDAZOLAM HCL 2 MG/2ML IJ SOLN
1.0000 mg | Freq: Once | INTRAMUSCULAR | Status: AC
  Administered 2016-07-07: 1 mg via INTRAVENOUS

## 2016-07-07 MED ORDER — ONDANSETRON HCL 4 MG/2ML IJ SOLN
INTRAMUSCULAR | Status: AC
Start: 2016-07-07 — End: 2016-07-07
  Filled 2016-07-07: qty 2

## 2016-07-07 MED ORDER — LIDOCAINE HCL (CARDIAC) 20 MG/ML IV SOLN
INTRAVENOUS | Status: DC | PRN
  Administered 2016-07-07: 100 mg via INTRAVENOUS

## 2016-07-07 MED ORDER — FENTANYL CITRATE (PF) 100 MCG/2ML IJ SOLN
INTRAMUSCULAR | Status: AC
  Administered 2016-07-07: 50 ug via INTRAVENOUS
  Filled 2016-07-07: qty 2

## 2016-07-07 MED ORDER — ONDANSETRON HCL 4 MG/2ML IJ SOLN
INTRAMUSCULAR | Status: DC | PRN
  Administered 2016-07-07: 4 mg via INTRAVENOUS

## 2016-07-07 MED ORDER — FAMOTIDINE 20 MG PO TABS
20.0000 mg | ORAL_TABLET | Freq: Once | ORAL | Status: AC
  Administered 2016-07-07: 20 mg via ORAL

## 2016-07-07 MED ORDER — ROCURONIUM BROMIDE 50 MG/5ML IV SOLN
INTRAVENOUS | Status: AC
  Filled 2016-07-07: qty 1

## 2016-07-07 MED ORDER — PHENYLEPHRINE HCL 10 MG/ML IJ SOLN
INTRAMUSCULAR | Status: DC | PRN
  Administered 2016-07-07: 50 ug via INTRAVENOUS
  Administered 2016-07-07: 100 ug via INTRAVENOUS
  Administered 2016-07-07: 50 ug via INTRAVENOUS

## 2016-07-07 MED ORDER — SUCCINYLCHOLINE CHLORIDE 20 MG/ML IJ SOLN
INTRAMUSCULAR | Status: DC | PRN
  Administered 2016-07-07: 100 mg via INTRAVENOUS

## 2016-07-07 MED ORDER — FAMOTIDINE 20 MG PO TABS
ORAL_TABLET | ORAL | Status: AC
  Administered 2016-07-07: 20 mg via ORAL
  Filled 2016-07-07: qty 1

## 2016-07-07 MED ORDER — MIDAZOLAM HCL 2 MG/2ML IJ SOLN
INTRAMUSCULAR | Status: AC
  Administered 2016-07-07: 1 mg via INTRAVENOUS
  Filled 2016-07-07: qty 2

## 2016-07-07 MED ORDER — SUGAMMADEX SODIUM 200 MG/2ML IV SOLN
INTRAVENOUS | Status: AC
  Filled 2016-07-07: qty 2

## 2016-07-07 MED ORDER — FENTANYL CITRATE (PF) 100 MCG/2ML IJ SOLN: 25.0000 ug | INTRAMUSCULAR | Status: DC | PRN

## 2016-07-07 MED ORDER — LIDOCAINE HCL (PF) 2 % IJ SOLN
INTRAMUSCULAR | Status: AC
  Filled 2016-07-07: qty 2

## 2016-07-07 MED ORDER — CEFAZOLIN SODIUM-DEXTROSE 2-4 GM/100ML-% IV SOLN
INTRAVENOUS | Status: AC
  Filled 2016-07-07: qty 100

## 2016-07-07 MED ORDER — ROPIVACAINE HCL 5 MG/ML IJ SOLN
INTRAMUSCULAR | Status: AC
  Filled 2016-07-07: qty 30

## 2016-07-07 MED ORDER — ROCURONIUM BROMIDE 100 MG/10ML IV SOLN
INTRAVENOUS | Status: DC | PRN
  Administered 2016-07-07: 40 mg via INTRAVENOUS

## 2016-07-07 MED ORDER — SUGAMMADEX SODIUM 200 MG/2ML IV SOLN
INTRAVENOUS | Status: DC | PRN
  Administered 2016-07-07: 200 mg via INTRAVENOUS

## 2016-07-07 MED ORDER — PROPOFOL 10 MG/ML IV BOLUS
INTRAVENOUS | Status: AC
  Filled 2016-07-07: qty 20

## 2016-07-07 MED ORDER — ONDANSETRON HCL 4 MG/2ML IJ SOLN: 4.0000 mg | Freq: Once | INTRAMUSCULAR | Status: DC | PRN

## 2016-07-07 MED ORDER — ACETAMINOPHEN 10 MG/ML IV SOLN
INTRAVENOUS | Status: AC
  Filled 2016-07-07: qty 100

## 2016-07-07 MED ORDER — LACTATED RINGERS IV SOLN
INTRAVENOUS | Status: DC
  Administered 2016-07-07: 1000 mL via INTRAVENOUS

## 2016-07-07 MED ORDER — OXYCODONE HCL 5 MG PO TABS: 5.0000 mg | ORAL_TABLET | ORAL | 0 refills | Status: DC | PRN

## 2016-07-07 MED ORDER — LACTATED RINGERS IV SOLN
INTRAVENOUS | Status: DC | PRN
  Administered 2016-07-07: 2 mL

## 2016-07-07 MED ORDER — DEXAMETHASONE SODIUM PHOSPHATE 10 MG/ML IJ SOLN
INTRAMUSCULAR | Status: DC | PRN
  Administered 2016-07-07: 5 mg via INTRAVENOUS

## 2016-07-07 MED ORDER — SUCCINYLCHOLINE CHLORIDE 20 MG/ML IJ SOLN
INTRAMUSCULAR | Status: AC
  Filled 2016-07-07: qty 1

## 2016-07-07 MED ORDER — PROPOFOL 10 MG/ML IV BOLUS
INTRAVENOUS | Status: DC | PRN
  Administered 2016-07-07: 150 mg via INTRAVENOUS

## 2016-07-07 MED ORDER — LIDOCAINE HCL (PF) 1 % IJ SOLN
INTRAMUSCULAR | Status: DC
Start: 2016-07-07 — End: 2016-07-07
  Filled 2016-07-07: qty 5

## 2016-07-07 MED ORDER — DEXAMETHASONE SODIUM PHOSPHATE 10 MG/ML IJ SOLN
INTRAMUSCULAR | Status: AC
  Filled 2016-07-07: qty 1

## 2016-07-07 MED ORDER — ROPIVACAINE HCL 5 MG/ML IJ SOLN
INTRAMUSCULAR | Status: DC | PRN
  Administered 2016-07-07: 20 mL via EPIDURAL

## 2016-07-07 SURGICAL SUPPLY — 45 items
ANCHOR JUGGERKNOT WTAP NDL 2.9 (Anchor) ×9 IMPLANT
ANCHOR SUT QUATTRO KNTLS 4.5 (Anchor) ×6 IMPLANT
BIT DRILL JUGRKNT W/NDL BIT2.9 (DRILL) ×2 IMPLANT
BLADE FULL RADIUS 3.5 (BLADE) ×3 IMPLANT
BUR ACROMIONIZER 4.0 (BURR) ×3 IMPLANT
CANNULA SHAVER 8MMX76MM (CANNULA) ×3 IMPLANT
CHLORAPREP W/TINT 26ML (MISCELLANEOUS) ×3 IMPLANT
COVER MAYO STAND STRL (DRAPES) ×3 IMPLANT
DRAPE IMP U-DRAPE 54X76 (DRAPES) ×6 IMPLANT
DRILL JUGGERKNOT W/NDL BIT 2.9 (DRILL) ×3
DRSG OPSITE POSTOP 4X8 (GAUZE/BANDAGES/DRESSINGS) ×3 IMPLANT
ELECT REM PT RETURN 9FT ADLT (ELECTROSURGICAL) ×3
ELECTRODE REM PT RTRN 9FT ADLT (ELECTROSURGICAL) ×2 IMPLANT
GAUZE PETRO XEROFOAM 1X8 (MISCELLANEOUS) ×3 IMPLANT
GAUZE SPONGE 4X4 12PLY STRL (GAUZE/BANDAGES/DRESSINGS) ×3 IMPLANT
GLOVE BIO SURGEON STRL SZ7.5 (GLOVE) ×6 IMPLANT
GLOVE BIO SURGEON STRL SZ8 (GLOVE) ×6 IMPLANT
GLOVE BIOGEL PI IND STRL 8 (GLOVE) ×2 IMPLANT
GLOVE BIOGEL PI INDICATOR 8 (GLOVE) ×1
GLOVE INDICATOR 8.0 STRL GRN (GLOVE) ×3 IMPLANT
GOWN STRL REUS W/ TWL LRG LVL3 (GOWN DISPOSABLE) ×2 IMPLANT
GOWN STRL REUS W/ TWL XL LVL3 (GOWN DISPOSABLE) ×2 IMPLANT
GOWN STRL REUS W/TWL LRG LVL3 (GOWN DISPOSABLE) ×1
GOWN STRL REUS W/TWL XL LVL3 (GOWN DISPOSABLE) ×1
GRASPER SUT 15 45D LOW PRO (SUTURE) IMPLANT
IV LACTATED RINGER IRRG 3000ML (IV SOLUTION) ×2
IV LR IRRIG 3000ML ARTHROMATIC (IV SOLUTION) ×4 IMPLANT
MANIFOLD NEPTUNE II (INSTRUMENTS) ×3 IMPLANT
MASK FACE SPIDER DISP (MASK) ×3 IMPLANT
MAT BLUE FLOOR 46X72 FLO (MISCELLANEOUS) ×3 IMPLANT
NDL MAYO CATGUT SZ5 (NEEDLE)
NDL SUT 5 .5 CRC TPR PNT MAYO (NEEDLE) IMPLANT
NEEDLE REVERSE CUT 1/2 CRC (NEEDLE) IMPLANT
PACK ARTHROSCOPY SHOULDER (MISCELLANEOUS) ×3 IMPLANT
SLING ARM LRG DEEP (SOFTGOODS) IMPLANT
SLING ULTRA II LG (MISCELLANEOUS) ×3 IMPLANT
STAPLER SKIN PROX 35W (STAPLE) ×3 IMPLANT
STRAP SAFETY BODY (MISCELLANEOUS) ×3 IMPLANT
SUT ETHIBOND 0 MO6 C/R (SUTURE) ×3 IMPLANT
SUT VIC AB 2-0 CT1 27 (SUTURE) ×2
SUT VIC AB 2-0 CT1 TAPERPNT 27 (SUTURE) ×4 IMPLANT
TAPE MICROFOAM 4IN (TAPE) ×3 IMPLANT
TUBING ARTHRO INFLOW-ONLY STRL (TUBING) ×3 IMPLANT
TUBING CONNECTING 10 (TUBING) ×3 IMPLANT
WAND HAND CNTRL MULTIVAC 90 (MISCELLANEOUS) ×3 IMPLANT

## 2016-07-07 NOTE — Anesthesia Preprocedure Evaluation (Signed)
Anesthesia Evaluation  Patient identified by MRN, date of birth, ID band Patient awake    Reviewed: Allergy & Precautions, NPO status , Patient's Chart, lab work & pertinent test results, reviewed documented beta blocker date and time   Airway Mallampati: III  TM Distance: >3 FB     Dental  (+) Chipped   Pulmonary former smoker,           Cardiovascular hypertension, Pt. on medications      Neuro/Psych    GI/Hepatic   Endo/Other    Renal/GU      Musculoskeletal  (+) Arthritis ,   Abdominal   Peds  Hematology   Anesthesia Other Findings   Reproductive/Obstetrics                             Anesthesia Physical Anesthesia Plan  ASA: III  Anesthesia Plan: General   Post-op Pain Management:    Induction: Intravenous  Airway Management Planned: Oral ETT  Additional Equipment:   Intra-op Plan:   Post-operative Plan:   Informed Consent: I have reviewed the patients History and Physical, chart, labs and discussed the procedure including the risks, benefits and alternatives for the proposed anesthesia with the patient or authorized representative who has indicated his/her understanding and acceptance.     Plan Discussed with: CRNA  Anesthesia Plan Comments:         Anesthesia Quick Evaluation

## 2016-07-07 NOTE — Anesthesia Procedure Notes (Signed)
Procedure Name: Intubation Date/Time: 07/07/2016 7:45 AM Performed by: Irving BurtonBACHICH, Averie Hornbaker Pre-anesthesia Checklist: Patient identified, Emergency Drugs available, Suction available and Patient being monitored Patient Re-evaluated:Patient Re-evaluated prior to inductionOxygen Delivery Method: Circle system utilized Preoxygenation: Pre-oxygenation with 100% oxygen Intubation Type: IV induction Ventilation: Mask ventilation without difficulty Laryngoscope Size: McGraph and 4 Grade View: Grade I Tube type: Oral Tube size: 7.5 mm Number of attempts: 2 Airway Equipment and Method: Stylet and Video-laryngoscopy Placement Confirmation: ETT inserted through vocal cords under direct vision,  positive ETCO2 and breath sounds checked- equal and bilateral Secured at: 21 cm Tube secured with: Tape Dental Injury: Teeth and Oropharynx as per pre-operative assessment  Difficulty Due To: Difficulty was anticipated, Difficult Airway- due to immobile epiglottis and Difficult Airway- due to anterior larynx

## 2016-07-07 NOTE — Anesthesia Post-op Follow-up Note (Cosign Needed)
Anesthesia QCDR form completed.        

## 2016-07-07 NOTE — Op Note (Signed)
07/07/2016  9:21 AM  Patient:   Arnette SchaumannMark L Drury  Pre-Op Diagnosis:   Impingement/tendinopathy with near full-thickness rotator cuff tear, right shoulder.  Post-Op Diagnosis: Impingement/tendinopathy with near full-thickness rotator cuff tear, labral fraying, biceps tendinopathy, and adhesive capsulitis, right shoulder.  Procedure: Limited arthroscopic debridement, arthroscopic subacromial decompression, mini-open rotator cuff repair, mini-open biceps tenodesis, and manipulation under anesthesia, right shoulder.  Anesthesia: General endotracheal with interscalene block placed preoperatively by the anesthesiologist.  Surgeon:   Maryagnes AmosJ. Jeffrey Poggi, MD  Assistant:   Horris LatinoLance McGhee, PA-C  Findings: As above. Prior to manipulation, the patient could be forward flexed to 110, abducted to 100, and, at 90 abduction be externally rotated to 70 and internally rotated to 35. Following manipulation, he could be forward flexed to 145, abducted to 135, and, at 90 of abduction be externally rotated to 90 and internally rotated to 60. There was moderate labral fraying involving the superior portion of the labrum without frank detachment. There was a near full-thickness bursal surface tear involving the mid-insertional fibers of the supraspinatus tendon. The remainder of the rotator cuff was in excellent condition. There was moderate induration of the biceps tendon without actual fraying or tearing. The articular surfaces of the glenoid and humerus both were in excellent condition.  Complications: None  Fluids:   400 cc  Estimated blood loss: 10 cc  Tourniquet time: None  Drains: None  Closure: Staples   Brief clinical note: The patient is a 76 year old male with a history of right shoulder pain. The patient's symptoms have progressed despite medications, activity modification, etc. The patient's history and examination are consistent with impingement/tendinopathy with a  rotator cuff tear. These findings were confirmed by MRI scan. The patient presents at this time for definitive management of these shoulder symptoms.  Procedure: The patient underwent placement of an interscalene block by the anesthesiologist in the preoperative holding area before being brought into the operating room and lain in the supine position. The patient then underwent general endotracheal intubation and anesthesia before being repositioned in the beach chair position using the beach chair positioner. The right shoulder and upper extremity were prepped with ChloraPrep solution before being draped sterilely. Preoperative antibiotics were administered. A timeout was performed to confirm the proper surgical site before the expected portal sites and incision site were injected with 0.5% Sensorcaine with epinephrine. A posterior portal was created and the glenohumeral joint thoroughly inspected with the findings as described above. An anterior portal was created using an outside-in technique. The labrum and rotator cuff were further probed, again confirming the above-noted findings. The labrum was carefully probed and found to be attached to the glenoid circumferentially. The areas of labral fraying were debrided back to stable margins using the full-radius resector. Areas of synovitis also were debrided using the full-radius resector. The ArthroCare wand was inserted and used to release the biceps from its labral attachment, as well as to obtain hemostasis and to "anneal" the labrum superiorly. The instruments were removed from the joint after suctioning the excess fluid.  The camera was repositioned through the posterior portal into the subacromial space. A separate lateral portal was created using an outside-in technique. The 3.5 mm full-radius resector was introduced and used to perform a subtotal bursectomy. The ArthroCare wand was then inserted and used to remove the periosteal tissue off the  undersurface of the anterior third of the acromion as well as to recess the coracoacromial ligament from its attachment along the anterior and lateral margins of the  acromion. The 4.0 mm acromionizing bur was introduced and used to complete the decompression by removing the undersurface of the anterior third of the acromion. The full radius resector was reintroduced to remove any residual bony debris before the ArthroCare wand was reintroduced to obtain hemostasis. The instruments were then removed from the subacromial space after suctioning the excess fluid.  An approximately 4-5 cm incision was made over the anterolateral aspect of the shoulder beginning at the anterolateral corner of the acromion and extending distally in line with the bicipital groove. This incision was carried down through the subcutaneous tissues to expose the deltoid fascia. The raphae between the anterior and middle thirds was identified and this plane developed to provide access into the subacromial space. Additional bursal tissues were debrided sharply using Metzenbaum scissors. The rotator cuff tear was readily identified. The margins were debrided sharply with a #15 blade and the exposed greater tuberosity roughened with a rongeur. The tear was repaired using two Biomet 2.9 mm JuggerKnot anchors. These sutures were then brought back laterally and secured using two [place] QuatroLink anchors to create a two-layer closure. An apparent watertight closure was obtained.  The bicipital groove was identified by palpation and opened for 1-1.5 cm. The biceps tendon stump was retrieved through this defect. The floor of the bicipital groove was roughened with a curet before another Biomet 2.9 mm JuggerKnot anchor was inserted. Both sets of sutures were passed through the biceps tendon and tied securely to effect the tenodesis. The bicipital sheath was reapproximated using two #0 Ethibond interrupted sutures, incorporating the biceps tendon to  further reinforce the tenodesis.  The wound was copiously irrigated with sterile saline solution before the deltoid raphae was reapproximated using 2-0 Vicryl interrupted sutures. The subcutaneous tissues were closed in two layers using 2-0 Vicryl interrupted sutures before the skin was closed using staples. The portal sites also were closed using staples. A sterile bulky dressing was applied to the shoulder before the arm was placed into a shoulder immobilizer. The patient was then awakened, extubated, and returned to the recovery room in satisfactory condition after tolerating the procedure well.

## 2016-07-07 NOTE — Discharge Instructions (Addendum)
Keep dressing dry and intact.  May shower after dressing changed on post-op day #4 (Monday).  Cover staples with Band-Aids after drying off. Apply ice frequently to shoulder. Take oxycodone as prescribed when needed.  May supplement with ES Tylenol if necessary. Keep shoulder immobilizer on at all times except may remove for bathing purposes. Follow-up in 10-14 days or as scheduled.  AMBULATORY SURGERY  DISCHARGE INSTRUCTIONS   1) The drugs that you were given will stay in your system until tomorrow so for the next 24 hours you should not:  A) Drive an automobile B) Make any legal decisions C) Drink any alcoholic beverage   2) You may resume regular meals tomorrow.  Today it is better to start with liquids and gradually work up to solid foods.  You may eat anything you prefer, but it is better to start with liquids, then soup and crackers, and gradually work up to solid foods.   3) Please notify your doctor immediately if you have any unusual bleeding, trouble breathing, redness and pain at the surgery site, drainage, fever, or pain not relieved by medication.    4) Additional Instructions: TAKE A STOOL SOFTENER TWICE A DAY WHILE TAKING NARCOTIC PAIN MEDICINE TO PREVENT CONSTIPATION   Please contact your physician with any problems or Same Day Surgery at [phone], Monday through Friday 6 am to 4 pm, or [place] at [facility] Main number at [phone].

## 2016-07-07 NOTE — Transfer of Care (Signed)
Immediate Anesthesia Transfer of Care Note  Patient: Colin SchaumannMark L Smith  Procedure(s) Performed: Procedure(s) with comments: SHOULDER ARTHROSCOPY WITH OPEN ROTATOR CUFF REPAIR (Right) SHOULDER ARTHROSCOPY WITH SUBACROMIAL DECOMPRESSION, ROTATOR CUFF REPAIR AND BICEP TENDON REPAIR (Right) - Debridement SHOULDER ARTHROSCOPY WITH DISTAL CLAVICLE RESECTION (Right) EXAM UNDER ANESTHESIA WITH MANIPULATION OF SHOULDER (Right)  Patient Location: PACU  Anesthesia Type:General  Level of Consciousness: awake, alert  and oriented  Airway & Oxygen Therapy: Patient connected to face mask oxygen  Post-op Assessment: Post -op Vital signs reviewed and stable  Post vital signs: stable  Last Vitals:  Vitals:   07/07/16 0730 07/07/16 0930  BP:  (!) 146/63  Pulse: 62 62  Resp: 14 17  Temp:  (!) 35.8 C    Last Pain:  Vitals:   07/07/16 0629  TempSrc: Tympanic         Complications: No apparent anesthesia complications

## 2016-07-07 NOTE — H&P (Signed)
Paper H&P to be scanned into permanent record. H&P reviewed and patient re-examined. No changes. 

## 2016-07-07 NOTE — Anesthesia Procedure Notes (Signed)
Anesthesia Regional Block: Interscalene brachial plexus block   Pre-Anesthetic Checklist: ,, timeout performed, Correct Patient, Correct Site, Correct Laterality, Correct Procedure, Correct Position, site marked, Risks and benefits discussed,  Surgical consent,  Pre-op evaluation,  At surgeon's request and post-op pain management   Prep: Betadine       Needles:  Injection technique: Single-shot  Needle Type: Echogenic Stimulator Needle     Needle Length: 5cm  Needle Gauge: 21     Additional Needles:   Procedures: ultrasound guided, nerve stimulator,,,,,,   Nerve Stimulator or Paresthesia:  Response: biceps flexion, 0.8 mA,   Additional Responses:   Narrative:  Injection made incrementally with aspirations every 5 mL.  Performed by: Personally  Anesthesiologist: Berdine AddisonHOMAS, Soledad Budreau  Additional Notes: Functioning IV was confirmed and monitors were applied.  A 50mm 22ga Arrow echogenic stimulator needle was used. Sterile prep and drape,hand hygiene and sterile gloves were used.  Negative aspiration and negative test dose prior to incremental administration of local anesthetic. The patient tolerated the procedure well.  Ultrasound guidance: relevent anatomy identified, needle position confirmed, local anesthetic spread visualized around nerve(s), vascular puncture avoided.  Image printed for medical record. 0.5% ropivicaine.

## 2016-07-07 NOTE — Anesthesia Postprocedure Evaluation (Signed)
Anesthesia Post Note  Patient: Colin SchaumannMark L Smith  Procedure(s) Performed: Procedure(s) (LRB): SHOULDER ARTHROSCOPY WITH OPEN ROTATOR CUFF REPAIR (Right) SHOULDER ARTHROSCOPY WITH SUBACROMIAL DECOMPRESSION, ROTATOR CUFF REPAIR AND BICEP TENDON REPAIR (Right) SHOULDER ARTHROSCOPY WITH DISTAL CLAVICLE RESECTION (Right) EXAM UNDER ANESTHESIA WITH MANIPULATION OF SHOULDER (Right)  Patient location during evaluation: PACU Anesthesia Type: General Level of consciousness: awake and alert Pain management: pain level controlled Vital Signs Assessment: post-procedure vital signs reviewed and stable Respiratory status: spontaneous breathing, nonlabored ventilation, respiratory function stable and patient connected to nasal cannula oxygen Cardiovascular status: blood pressure returned to baseline and stable Postop Assessment: no signs of nausea or vomiting Anesthetic complications: no     Last Vitals:  Vitals:   07/07/16 1019 07/07/16 1054  BP:  (!) 128/54  Pulse: (!) 51 (!) 58  Resp: 16 16  Temp:      Last Pain:  Vitals:   07/07/16 1125  TempSrc:   PainSc: 2                  [name]

## 2016-10-19 ENCOUNTER — Encounter: Payer: Self-pay | Admitting: *Deleted

## 2016-10-27 ENCOUNTER — Ambulatory Visit: Payer: Medicare Other | Admitting: Anesthesiology

## 2016-10-27 ENCOUNTER — Ambulatory Visit
Admission: RE | Admit: 2016-10-27 | Discharge: 2016-10-27 | Disposition: A | Discharge status: Home / Self Care | Payer: Medicare Other | Source: Ambulatory Visit | Attending: Ophthalmology | Admitting: Ophthalmology

## 2016-10-27 ENCOUNTER — Encounter
Admission: RE | Disposition: A | Discharge status: Home / Self Care | Payer: Self-pay | Source: Ambulatory Visit | Attending: Ophthalmology

## 2016-10-27 ENCOUNTER — Encounter: Payer: Self-pay | Admitting: *Deleted

## 2016-10-27 DIAGNOSIS — I10 Essential (primary) hypertension: Secondary | ICD-10-CM | POA: Diagnosis not present

## 2016-10-27 DIAGNOSIS — H2512 Age-related nuclear cataract, left eye: Secondary | ICD-10-CM | POA: Diagnosis not present

## 2016-10-27 DIAGNOSIS — Z87442 Personal history of urinary calculi: Secondary | ICD-10-CM | POA: Insufficient documentation

## 2016-10-27 DIAGNOSIS — Z886 Allergy status to analgesic agent status: Secondary | ICD-10-CM | POA: Insufficient documentation

## 2016-10-27 DIAGNOSIS — M1990 Unspecified osteoarthritis, unspecified site: Secondary | ICD-10-CM | POA: Insufficient documentation

## 2016-10-27 DIAGNOSIS — Z87891 Personal history of nicotine dependence: Secondary | ICD-10-CM | POA: Insufficient documentation

## 2016-10-27 DIAGNOSIS — E7800 Pure hypercholesterolemia, unspecified: Secondary | ICD-10-CM | POA: Insufficient documentation

## 2016-10-27 HISTORY — PX: CATARACT EXTRACTION W/PHACO: SHX586

## 2016-10-27 SURGERY — PHACOEMULSIFICATION, CATARACT, WITH IOL INSERTION
Anesthesia: Monitor Anesthesia Care | Site: Eye | Laterality: Left | Wound class: Clean

## 2016-10-27 MED ORDER — EPINEPHRINE PF 1 MG/ML IJ SOLN
INTRAMUSCULAR | Status: AC
  Filled 2016-10-27: qty 1

## 2016-10-27 MED ORDER — NA CHONDROIT SULF-NA HYALURON 40-17 MG/ML IO SOLN
INTRAOCULAR | Status: AC
  Filled 2016-10-27: qty 1

## 2016-10-27 MED ORDER — MOXIFLOXACIN HCL 0.5 % OP SOLN
OPHTHALMIC | Status: AC
  Filled 2016-10-27: qty 3

## 2016-10-27 MED ORDER — POVIDONE-IODINE 5 % OP SOLN
OPHTHALMIC | Status: DC | PRN
  Administered 2016-10-27: 1 via OPHTHALMIC

## 2016-10-27 MED ORDER — ARMC OPHTHALMIC DILATING DROPS
1.0000 "application " | OPHTHALMIC | Status: AC
  Administered 2016-10-27 (×3): 1 via OPHTHALMIC

## 2016-10-27 MED ORDER — MIDAZOLAM HCL 5 MG/5ML IJ SOLN
INTRAMUSCULAR | Status: DC | PRN
  Administered 2016-10-27 (×2): 1 mg via INTRAVENOUS

## 2016-10-27 MED ORDER — LIDOCAINE HCL (PF) 4 % IJ SOLN
INTRAMUSCULAR | Status: AC
  Filled 2016-10-27: qty 5

## 2016-10-27 MED ORDER — MIDAZOLAM HCL 2 MG/2ML IJ SOLN
INTRAMUSCULAR | Status: AC
  Filled 2016-10-27: qty 2

## 2016-10-27 MED ORDER — LIDOCAINE HCL (PF) 4 % IJ SOLN
INTRAMUSCULAR | Status: DC | PRN
  Administered 2016-10-27: 4 mL via OPHTHALMIC

## 2016-10-27 MED ORDER — POVIDONE-IODINE 5 % OP SOLN
OPHTHALMIC | Status: AC
  Filled 2016-10-27: qty 30

## 2016-10-27 MED ORDER — MOXIFLOXACIN HCL 0.5 % OP SOLN: 1.0000 [drp] | OPHTHALMIC | Status: DC | PRN

## 2016-10-27 MED ORDER — MOXIFLOXACIN HCL 0.5 % OP SOLN
OPHTHALMIC | Status: DC | PRN
  Administered 2016-10-27: 0.2 mL via OPHTHALMIC

## 2016-10-27 MED ORDER — BSS IO SOLN
INTRAOCULAR | Status: DC | PRN
  Administered 2016-10-27: 200 mL via INTRAOCULAR

## 2016-10-27 MED ORDER — ARMC OPHTHALMIC DILATING DROPS
OPHTHALMIC | Status: AC
  Administered 2016-10-27: 1 via OPHTHALMIC
  Filled 2016-10-27: qty 0.4

## 2016-10-27 MED ORDER — SODIUM CHLORIDE 0.9 % IV SOLN
INTRAVENOUS | Status: DC
  Administered 2016-10-27 (×2): via INTRAVENOUS

## 2016-10-27 SURGICAL SUPPLY — 16 items
DISSECTOR HYDRO NUCLEUS 50X22 (MISCELLANEOUS) ×2 IMPLANT
GLOVE BIO SURGEON STRL SZ8 (GLOVE) ×2 IMPLANT
GLOVE BIOGEL M 6.5 STRL (GLOVE) ×2 IMPLANT
GLOVE SURG LX 7.5 STRW (GLOVE) ×1
GLOVE SURG LX STRL 7.5 STRW (GLOVE) ×1 IMPLANT
GOWN STRL REUS W/ TWL LRG LVL3 (GOWN DISPOSABLE) ×2 IMPLANT
GOWN STRL REUS W/TWL LRG LVL3 (GOWN DISPOSABLE) ×2
LABEL CATARACT MEDS ST (LABEL) ×2 IMPLANT
LENS IOL TECNIS ITEC 17.5 (Intraocular Lens) ×2 IMPLANT
PACK CATARACT (MISCELLANEOUS) ×2 IMPLANT
PACK CATARACT KING (MISCELLANEOUS) ×2 IMPLANT
PACK EYE AFTER SURG (MISCELLANEOUS) ×2 IMPLANT
SOL BSS BAG (MISCELLANEOUS) ×2
SOLUTION BSS BAG (MISCELLANEOUS) ×1 IMPLANT
WATER STERILE IRR 250ML POUR (IV SOLUTION) ×2 IMPLANT
WIPE NON LINTING 3.25X3.25 (MISCELLANEOUS) ×2 IMPLANT

## 2016-10-27 NOTE — Transfer of Care (Signed)
Immediate Anesthesia Transfer of Care Note  Patient: Colin Smith  Procedure(s) Performed: Procedure(s) with comments: CATARACT EXTRACTION PHACO AND INTRAOCULAR LENS PLACEMENT (IOC) (Left) - Lot #[id number] H [place]: 00:33.0 AP%:13.2 CDE: 4.37   Patient Location: PACU  Anesthesia Type:MAC  Level of Consciousness: awake, alert , oriented and patient cooperative  Airway & Oxygen Therapy: Patient Spontanous Breathing  Post-op Assessment: Report given to RN, Post -op Vital signs reviewed and stable and Patient moving all extremities X 4  Post vital signs: Reviewed and stable  Last Vitals:  Vitals:   10/27/16 0818 10/27/16 1022  BP: (!) 160/94 (!) 147/68  Pulse: 63   Resp: 16 14  Temp: 36.6 C 36.7 C  SpO2: 98% 99%    Last Pain:  Vitals:   10/27/16 1022  TempSrc: Oral         Complications: No apparent anesthesia complications

## 2016-10-27 NOTE — Discharge Instructions (Signed)
Eye Surgery Discharge Instructions  Expect mild scratchy sensation or mild soreness. DO NOT RUB YOUR EYE!  The day of surgery:  Minimal physical activity, but bed rest is not required  No reading, computer work, or close hand work  No bending, lifting, or straining.  May watch TV  For 24 hours:  No driving, legal decisions, or alcoholic beverages  Safety precautions  Eat anything you prefer: It is better to start with liquids, then soup then solid foods.  _____ Eye patch should be worn until postoperative exam tomorrow.  ____ Solar shield eyeglasses should be worn for comfort in the sunlight/patch while sleeping  Resume all regular medications including aspirin or Coumadin if these were discontinued prior to surgery. You may shower, bathe, shave, or wash your hair. Tylenol may be taken for mild discomfort.  Call your doctor if you experience significant pain, nausea, or vomiting, fever > 101 or other signs of infection. [phone] or [phone] Specific instructions:  Follow-up Information    [place] CraneKing, Bradley Quanah, MD Follow up.   Specialty:  Ophthalmology Why:  September 7 at 9:15am Contact information: [address] [place] [address] [phone]

## 2016-10-27 NOTE — Anesthesia Postprocedure Evaluation (Signed)
Anesthesia Post Note  Patient: Colin Smith  Procedure(s) Performed: Procedure(s) (LRB): CATARACT EXTRACTION PHACO AND INTRAOCULAR LENS PLACEMENT (IOC) (Left)  Patient location during evaluation: PACU Anesthesia Type: MAC Level of consciousness: awake and alert Pain management: pain level controlled Vital Signs Assessment: post-procedure vital signs reviewed and stable Respiratory status: spontaneous breathing, nonlabored ventilation and respiratory function stable Cardiovascular status: stable and blood pressure returned to baseline Anesthetic complications: no     Last Vitals:  Vitals:   10/27/16 0818 10/27/16 1022  BP: (!) 160/94 (!) 147/68  Pulse: 63   Resp: 16 14  Temp: 36.6 C 36.7 C  SpO2: 98% 99%    Last Pain:  Vitals:   10/27/16 1022  TempSrc: Oral                 Silvana Newness A

## 2016-10-27 NOTE — Anesthesia Post-op Follow-up Note (Signed)
Anesthesia QCDR form completed.        

## 2016-10-27 NOTE — Op Note (Signed)
OPERATIVE NOTE  Colin SchaumannMark L Cottrill [id number] 10/27/2016   PREOPERATIVE DIAGNOSIS:  Nuclear sclerotic cataract left eye.  H25.12   POSTOPERATIVE DIAGNOSIS:    Nuclear sclerotic cataract left eye.     PROCEDURE:  Phacoemusification with posterior chamber intraocular lens placement of the left eye   LENS:   Implant Name Type Inv. Item Serial No. Manufacturer Lot No. LRB No. Used  LENS IOL DIOP 17.5 - [id number] Intraocular Lens LENS IOL DIOP 17.5 [id number] AMO   Left 1       PCB00 +17.5   ULTRASOUND TIME: 0 minutes 33 seconds.  CDE 4.37   SURGEON:  Willey BladeBradley Maricus Tanzi, MD, MPH   ANESTHESIA:  Topical with tetracaine drops augmented with 1% preservative-free intracameral lidocaine.  ESTIMATED BLOOD LOSS: <1 mL   COMPLICATIONS:  None.   DESCRIPTION OF PROCEDURE:  The patient was identified in the holding room and transported to the operating room and placed in the supine position under the operating microscope.  The left eye was identified as the operative eye and it was prepped and draped in the usual sterile ophthalmic fashion.   A 1.0 millimeter clear-corneal paracentesis was made at the 5:00 position. 0.5 ml of preservative-free 1% lidocaine with epinephrine was injected into the anterior chamber.  The anterior chamber was filled with Discovisc viscoelastic.  A 2.4 millimeter keratome was used to make a near-clear corneal incision at the 2:00 position.  A curvilinear capsulorrhexis was made with a cystotome and capsulorrhexis forceps.  Balanced salt solution was used to hydrodissect and hydrodelineate the nucleus.   Phacoemulsification was then used in stop and chop fashion to remove the lens nucleus and epinucleus.  The remaining cortex was then removed using the irrigation and aspiration handpiece. Discovisc was then placed into the capsular bag to distend it for lens placement.  A lens was then injected into the capsular bag.  The remaining viscoelastic was aspirated.   Wounds were  hydrated with balanced salt solution.  The anterior chamber was inflated to a physiologic pressure with balanced salt solution.  Intracameral vigamox 0.1 mL undiltued was injected into the eye and a drop placed onto the ocular surface.  No wound leaks were noted.  The patient was taken to the recovery room in stable condition without complications of anesthesia or surgery  Willey BladeBradley Korrina Zern 10/27/2016, 10:19 AM

## 2016-10-27 NOTE — H&P (Signed)
The History and Physical notes are on paper, have been signed, and are to be scanned.   I have examined the patient and there are no changes to the H&P.   Willey BladeBradley King 10/27/2016 9:43 AM

## 2016-10-27 NOTE — Anesthesia Preprocedure Evaluation (Signed)
Anesthesia Evaluation  Patient identified by MRN, date of birth, ID band Patient awake    Reviewed: Allergy & Precautions, NPO status , Patient's Chart, lab work & pertinent test results, reviewed documented beta blocker date and time   Airway Mallampati: III  TM Distance: >3 FB     Dental  (+) Chipped   Pulmonary former smoker,           Cardiovascular hypertension, Pt. on medications      Neuro/Psych    GI/Hepatic   Endo/Other    Renal/GU      Musculoskeletal  (+) Arthritis ,   Abdominal   Peds  Hematology   Anesthesia Other Findings   Reproductive/Obstetrics                             Anesthesia Physical Anesthesia Plan  ASA: III  Anesthesia Plan: MAC   Post-op Pain Management:    Induction:   PONV Risk Score and Plan:   Airway Management Planned:   Additional Equipment:   Intra-op Plan:   Post-operative Plan:   Informed Consent: I have reviewed the patients History and Physical, chart, labs and discussed the procedure including the risks, benefits and alternatives for the proposed anesthesia with the patient or authorized representative who has indicated his/her understanding and acceptance.     Plan Discussed with: CRNA  Anesthesia Plan Comments:         Anesthesia Quick Evaluation

## 2016-11-21 ENCOUNTER — Encounter: Payer: Self-pay | Admitting: *Deleted

## 2016-11-24 ENCOUNTER — Ambulatory Visit: Payer: Medicare Other | Admitting: Anesthesiology

## 2016-11-24 ENCOUNTER — Encounter: Payer: Self-pay | Admitting: *Deleted

## 2016-11-24 ENCOUNTER — Ambulatory Visit
Admission: RE | Admit: 2016-11-24 | Discharge: 2016-11-24 | Disposition: A | Discharge status: Home / Self Care | Payer: Medicare Other | Source: Ambulatory Visit | Attending: Ophthalmology | Admitting: Ophthalmology

## 2016-11-24 ENCOUNTER — Encounter
Admission: RE | Disposition: A | Discharge status: Home / Self Care | Payer: Self-pay | Source: Ambulatory Visit | Attending: Ophthalmology

## 2016-11-24 DIAGNOSIS — H2511 Age-related nuclear cataract, right eye: Secondary | ICD-10-CM | POA: Diagnosis not present

## 2016-11-24 DIAGNOSIS — E7800 Pure hypercholesterolemia, unspecified: Secondary | ICD-10-CM | POA: Diagnosis not present

## 2016-11-24 DIAGNOSIS — Z87891 Personal history of nicotine dependence: Secondary | ICD-10-CM | POA: Diagnosis not present

## 2016-11-24 DIAGNOSIS — I10 Essential (primary) hypertension: Secondary | ICD-10-CM | POA: Insufficient documentation

## 2016-11-24 DIAGNOSIS — Z79899 Other long term (current) drug therapy: Secondary | ICD-10-CM | POA: Insufficient documentation

## 2016-11-24 HISTORY — DX: Pneumonia, unspecified organism: J18.9

## 2016-11-24 HISTORY — DX: Calculus of kidney: N20.0

## 2016-11-24 HISTORY — DX: Benign prostatic hyperplasia without lower urinary tract symptoms: N40.0

## 2016-11-24 HISTORY — PX: CATARACT EXTRACTION W/PHACO: SHX586

## 2016-11-24 SURGERY — PHACOEMULSIFICATION, CATARACT, WITH IOL INSERTION
Anesthesia: Monitor Anesthesia Care | Site: Eye | Laterality: Right | Wound class: Clean

## 2016-11-24 MED ORDER — EPINEPHRINE PF 1 MG/ML IJ SOLN
INTRAMUSCULAR | Status: AC
  Filled 2016-11-24: qty 3

## 2016-11-24 MED ORDER — MIDAZOLAM HCL 2 MG/2ML IJ SOLN
INTRAMUSCULAR | Status: DC | PRN
  Administered 2016-11-24: 2 mg via INTRAVENOUS

## 2016-11-24 MED ORDER — ARMC OPHTHALMIC DILATING DROPS
OPHTHALMIC | Status: AC
  Administered 2016-11-24: 1 via OPHTHALMIC
  Filled 2016-11-24: qty 0.4

## 2016-11-24 MED ORDER — POVIDONE-IODINE 5 % OP SOLN
OPHTHALMIC | Status: AC
  Filled 2016-11-24: qty 150

## 2016-11-24 MED ORDER — MIDAZOLAM HCL 2 MG/2ML IJ SOLN
INTRAMUSCULAR | Status: AC
Start: 2016-11-24 — End: 2016-11-24
  Filled 2016-11-24: qty 2

## 2016-11-24 MED ORDER — SODIUM CHLORIDE 0.9 % IV SOLN
INTRAVENOUS | Status: DC
  Administered 2016-11-24: 09:00:00 via INTRAVENOUS

## 2016-11-24 MED ORDER — NA CHONDROIT SULF-NA HYALURON 40-17 MG/ML IO SOLN
INTRAOCULAR | Status: AC
  Filled 2016-11-24: qty 5

## 2016-11-24 MED ORDER — LIDOCAINE HCL (PF) 4 % IJ SOLN
INTRAMUSCULAR | Status: AC
  Filled 2016-11-24: qty 15

## 2016-11-24 MED ORDER — EPINEPHRINE PF 1 MG/ML IJ SOLN
INTRAMUSCULAR | Status: AC
  Filled 2016-11-24: qty 1

## 2016-11-24 MED ORDER — MOXIFLOXACIN HCL 0.5 % OP SOLN
OPHTHALMIC | Status: DC | PRN
  Administered 2016-11-24: 0.2 mL via OPHTHALMIC

## 2016-11-24 MED ORDER — NA CHONDROIT SULF-NA HYALURON 40-30 MG/ML IO SOLN
INTRAOCULAR | Status: DC | PRN
  Administered 2016-11-24: 1 mL via INTRAOCULAR

## 2016-11-24 MED ORDER — LIDOCAINE HCL (PF) 4 % IJ SOLN
INTRAMUSCULAR | Status: AC
  Filled 2016-11-24: qty 5

## 2016-11-24 MED ORDER — LIDOCAINE HCL (PF) 4 % IJ SOLN
INTRAMUSCULAR | Status: DC | PRN
  Administered 2016-11-24: 1 mL via OPHTHALMIC

## 2016-11-24 MED ORDER — FENTANYL CITRATE (PF) 100 MCG/2ML IJ SOLN
INTRAMUSCULAR | Status: DC | PRN
  Administered 2016-11-24 (×2): 50 ug via INTRAVENOUS

## 2016-11-24 MED ORDER — FENTANYL CITRATE (PF) 100 MCG/2ML IJ SOLN
INTRAMUSCULAR | Status: AC
  Filled 2016-11-24: qty 2

## 2016-11-24 MED ORDER — ARMC OPHTHALMIC DILATING DROPS
1.0000 "application " | OPHTHALMIC | Status: DC
  Administered 2016-11-24 (×2): 1 via OPHTHALMIC

## 2016-11-24 MED ORDER — MOXIFLOXACIN HCL 0.5 % OP SOLN: 1.0000 [drp] | OPHTHALMIC | Status: DC | PRN

## 2016-11-24 MED ORDER — POVIDONE-IODINE 5 % OP SOLN
OPHTHALMIC | Status: DC | PRN
  Administered 2016-11-24: 1 via OPHTHALMIC

## 2016-11-24 MED ORDER — MOXIFLOXACIN HCL 0.5 % OP SOLN
OPHTHALMIC | Status: AC
  Filled 2016-11-24: qty 3

## 2016-11-24 SURGICAL SUPPLY — 16 items
DISSECTOR HYDRO NUCLEUS 50X22 (MISCELLANEOUS) ×3 IMPLANT
GLOVE BIO SURGEON STRL SZ8 (GLOVE) ×6 IMPLANT
GLOVE BIOGEL M 6.5 STRL (GLOVE) ×6 IMPLANT
GLOVE SURG LX 7.5 STRW (GLOVE) ×2
GLOVE SURG LX STRL 7.5 STRW (GLOVE) ×1 IMPLANT
GOWN STRL REUS W/ TWL LRG LVL3 (GOWN DISPOSABLE) ×2 IMPLANT
GOWN STRL REUS W/TWL LRG LVL3 (GOWN DISPOSABLE) ×4
LABEL CATARACT MEDS ST (LABEL) ×3 IMPLANT
LENS IOL TECNIS ITEC 17.0 (Intraocular Lens) ×3 IMPLANT
PACK CATARACT (MISCELLANEOUS) ×3 IMPLANT
PACK CATARACT KING (MISCELLANEOUS) ×3 IMPLANT
PACK EYE AFTER SURG (MISCELLANEOUS) ×3 IMPLANT
SOL BSS BAG (MISCELLANEOUS) ×3
SOLUTION BSS BAG (MISCELLANEOUS) ×1 IMPLANT
WATER STERILE IRR 250ML POUR (IV SOLUTION) ×3 IMPLANT
WIPE NON LINTING 3.25X3.25 (MISCELLANEOUS) ×3 IMPLANT

## 2016-11-24 NOTE — Op Note (Signed)
OPERATIVE NOTE  Colin Smith [id number] 11/24/2016   PREOPERATIVE DIAGNOSIS:  Nuclear sclerotic cataract right eye.  H25.11   POSTOPERATIVE DIAGNOSIS:    Nuclear sclerotic cataract right eye.     PROCEDURE:  Phacoemusification with posterior chamber intraocular lens placement of the right eye   LENS:   Implant Name Type Inv. Item Serial No. Manufacturer Lot No. LRB No. Used  LENS IOL DIOP 17.0 - [id number] Intraocular Lens LENS IOL DIOP 17.0 [id number] AMO   Right 1       PCB00 +17.0   ULTRASOUND TIME: 0 minutes 36.1 seconds.  CDE 2.21   SURGEON:  Willey Blade, MD, MPH  ANESTHESIOLOGIST: Anesthesiologist: Lenard Simmer, MD CRNA: Almeta Monas, CRNA   ANESTHESIA:  Topical with tetracaine drops augmented with 1% preservative-free intracameral lidocaine.  ESTIMATED BLOOD LOSS: less than 1 mL.   COMPLICATIONS:  None.   DESCRIPTION OF PROCEDURE:  The patient was identified in the holding room and transported to the operating room and placed in the supine position under the operating microscope.  The right eye was identified as the operative eye and it was prepped and draped in the usual sterile ophthalmic fashion.   A 1.0 millimeter clear-corneal paracentesis was made at the 10:30 position. 0.5 ml of preservative-free 1% lidocaine with epinephrine was injected into the anterior chamber.  The anterior chamber was filled with Discovisc viscoelastic.  A 2.4 millimeter keratome was used to make a near-clear corneal incision at the 8:00 position.  A curvilinear capsulorrhexis was made with a cystotome and capsulorrhexis forceps.  Balanced salt solution was used to hydrodissect and hydrodelineate the nucleus.   Phacoemulsification was then used in stop and chop fashion to remove the lens nucleus and epinucleus.  The remaining cortex was then removed using the irrigation and aspiration handpiece. Discovisc was then placed into the capsular bag to distend it for lens placement.  A  lens was then injected into the capsular bag.  The remaining viscoelastic was aspirated.   Wounds were hydrated with balanced salt solution.  The anterior chamber was inflated to a physiologic pressure with balanced salt solution.   Intracameral vigamox 0.1 mL undiluted was injected into the eye and a drop placed onto the ocular surface.  No wound leaks were noted.  The patient was taken to the recovery room in stable condition without complications of anesthesia or surgery  Willey Blade 11/24/2016, 10:31 AM

## 2016-11-24 NOTE — Anesthesia Preprocedure Evaluation (Signed)
Anesthesia Evaluation  Patient identified by MRN, date of birth, ID band Patient awake    Reviewed: Allergy & Precautions, NPO status , Patient's Chart, lab work & pertinent test results, reviewed documented beta blocker date and time   Airway Mallampati: III  TM Distance: >3 FB     Dental  (+) Chipped   Pulmonary former smoker,           Cardiovascular hypertension, Pt. on medications      Neuro/Psych    GI/Hepatic   Endo/Other    Renal/GU Renal disease (kidney stones)     Musculoskeletal  (+) Arthritis ,   Abdominal   Peds  Hematology   Anesthesia Other Findings Past Medical History: No date: Arthritis No date: BPH (benign prostatic hyperplasia) No date: History of kidney stones No date: HOH (hard of hearing)     Comment:  Bilateral Hearing Aids No date: Hyperlipidemia No date: Kidney stones No date: Pneumonia     Comment:  in past   Reproductive/Obstetrics                             Anesthesia Physical  Anesthesia Plan  ASA: III  Anesthesia Plan: MAC   Post-op Pain Management:    Induction:   PONV Risk Score and Plan:   Airway Management Planned: Nasal Cannula  Additional Equipment:   Intra-op Plan:   Post-operative Plan:   Informed Consent: I have reviewed the patients History and Physical, chart, labs and discussed the procedure including the risks, benefits and alternatives for the proposed anesthesia with the patient or authorized representative who has indicated his/her understanding and acceptance.     Plan Discussed with: CRNA  Anesthesia Plan Comments:         Anesthesia Quick Evaluation

## 2016-11-24 NOTE — Transfer of Care (Signed)
Immediate Anesthesia Transfer of Care Note  Patient: Colin Smith  Procedure(s) Performed: CATARACT EXTRACTION PHACO AND INTRAOCULAR LENS PLACEMENT (IOC) (Right Eye) 2 Patient Location: PACU  Anesthesia Type:MAC  Level of Consciousness: awake  Airway & Oxygen Therapy: Patient Spontanous Breathing  Post-op Assessment: Report given to RN  Post vital signs: Reviewed and stable  Last Vitals:  Vitals:   11/21/16 1430 11/24/16 0834  BP: 122/69 132/66  Pulse: 64 62  Resp:  18  Temp:  (!) 36.4 C  SpO2:  98%    Last Pain:  Vitals:   11/24/16 0834  TempSrc: Tympanic         Complications: No apparent anesthesia complications

## 2016-11-24 NOTE — Discharge Instructions (Signed)
Eye Surgery Discharge Instructions  Expect mild scratchy sensation or mild soreness. DO NOT RUB YOUR EYE!  The day of surgery:  Minimal physical activity, but bed rest is not required  No reading, computer work, or close hand work  No bending, lifting, or straining.  May watch TV  For 24 hours:  No driving, legal decisions, or alcoholic beverages  Safety precautions  Eat anything you prefer: It is better to start with liquids, then soup then solid foods.  _____ Eye patch should be worn until postoperative exam tomorrow.  ____ Solar shield eyeglasses should be worn for comfort in the sunlight/patch while sleeping  Resume all regular medications including aspirin or Coumadin if these were discontinued prior to surgery. You may shower, bathe, shave, or wash your hair. Tylenol may be taken for mild discomfort.  Call your doctor if you experience significant pain, nausea, or vomiting, fever > 101 or other signs of infection. [phone] or [phone] Specific instructions:  Follow-up Information    [place] Crane, MD Follow up.   Specialty:  Ophthalmology Why:  October 5 at 10:40am in [facility] information: [address] [place] [address] [phone]

## 2016-11-24 NOTE — Anesthesia Postprocedure Evaluation (Signed)
Anesthesia Post Note  Patient: Colin Smith  Procedure(s) Performed: CATARACT EXTRACTION PHACO AND INTRAOCULAR LENS PLACEMENT (IOC) (Right Eye)  Patient location during evaluation: PACU Anesthesia Type: MAC Level of consciousness: awake and alert Pain management: pain level controlled Vital Signs Assessment: post-procedure vital signs reviewed and stable Respiratory status: spontaneous breathing, nonlabored ventilation, respiratory function stable and patient connected to nasal cannula oxygen Cardiovascular status: stable and blood pressure returned to baseline Postop Assessment: no apparent nausea or vomiting Anesthetic complications: no     Last Vitals:  Vitals:   11/24/16 1034 11/24/16 1038  BP: (!) 157/67 125/64  Pulse: 60 (!) 56  Resp: 18 16  Temp: (!) 35.8 C   SpO2: 97%     Last Pain:  Vitals:   11/24/16 0834  TempSrc: Tympanic                 Martha Clan

## 2016-11-24 NOTE — Anesthesia Post-op Follow-up Note (Signed)
Anesthesia QCDR form completed.        

## 2016-11-24 NOTE — H&P (Signed)
The History and Physical notes are on paper, have been signed, and are to be scanned.   I have examined the patient and there are no changes to the H&P.   Willey Blade 11/24/2016 9:58 AM

## 2016-12-07 ENCOUNTER — Encounter: Payer: Self-pay | Admitting: Family Medicine

## 2016-12-28 ENCOUNTER — Encounter: Payer: Self-pay | Admitting: Family Medicine

## 2016-12-28 ENCOUNTER — Ambulatory Visit: Payer: Medicare Other | Admitting: Internal Medicine

## 2016-12-28 ENCOUNTER — Ambulatory Visit (INDEPENDENT_AMBULATORY_CARE_PROVIDER_SITE_OTHER): Payer: Medicare Other | Admitting: Family Medicine

## 2016-12-28 ENCOUNTER — Telehealth: Payer: Self-pay

## 2016-12-28 VITALS — BP 144/76 | HR 62 | Temp 97.7°F | Ht 66.75 in | Wt 198.8 lb

## 2016-12-28 DIAGNOSIS — I10 Essential (primary) hypertension: Secondary | ICD-10-CM | POA: Diagnosis not present

## 2016-12-28 DIAGNOSIS — E782 Mixed hyperlipidemia: Secondary | ICD-10-CM

## 2016-12-28 LAB — COMPREHENSIVE METABOLIC PANEL
ALT: 16 U/L (ref 0–53)
AST: 18 U/L (ref 0–37)
Albumin: 4 g/dL (ref 3.5–5.2)
Alkaline Phosphatase: 83 U/L (ref 39–117)
BUN: 20 mg/dL (ref 6–23)
CO2: 26 meq/L (ref 19–32)
Calcium: 9.2 mg/dL (ref 8.4–10.5)
Chloride: 107 mEq/L (ref 96–112)
Creatinine, Ser: 0.92 mg/dL (ref 0.40–1.50)
GFR: 84.47 mL/min (ref >60.00)
GLUCOSE: 125 mg/dL — AB (ref 70–99)
POTASSIUM: 4.2 meq/L (ref 3.5–5.1)
Sodium: 140 mEq/L (ref 135–145)
Total Bilirubin: 0.5 mg/dL (ref 0.2–1.2)
Total Protein: 7.1 g/dL (ref 6.0–8.3)

## 2016-12-28 LAB — LIPID PANEL
CHOL/HDL RATIO: 3
Cholesterol: 125 mg/dL (ref 0–200)
HDL: 37.4 mg/dL — AB (ref >39.00)
LDL Cholesterol: 64 mg/dL (ref 0–99)
NONHDL: 87.47
Triglycerides: 118 mg/dL (ref 0.0–149.0)
VLDL: 23.6 mg/dL (ref 0.0–40.0)

## 2016-12-28 MED ORDER — ROSUVASTATIN CALCIUM 10 MG PO TABS: 10.0000 mg | ORAL_TABLET | Freq: Every day | ORAL | 3 refills | Status: DC

## 2016-12-28 NOTE — Progress Notes (Signed)
Subjective:   Patient ID: Colin SchaumannMark L Smith, male    DOB: 03/02/1940, 76 y.o.   MRN: [id number]  Colin Smith is a pleasant 76 y.o. year old male who presents to clinic today with Establish Care (Patient is here today to establish care.  Not currently fasting.  Vision and hearing up-to-date. )  on 12/28/2016  HPI:  HLD- taking Crestor 10 mg daily. Due for labs.  Takes no other prescription rxs.  Retired Pensions consultantattorney.  Stays active playing pickle ball, reading.  No results found for: CHOL, HDL, LDLCALC, LDLDIRECT, TRIG, CHOLHDL   Current Outpatient Medications on File Prior to Visit  Medication Sig Dispense Refill  . acetaminophen (TYLENOL) 500 MG tablet Take 1,000 mg by mouth every 6 (six) hours as needed for mild pain.    . Ascorbic Acid (VITAMIN C) 1000 MG tablet Take 1,000 mg by mouth daily.    . Cholecalciferol (VITAMIN D3) 5000 units TABS Take 5,000 Units by mouth every other day.    . Cyanocobalamin (VITAMIN B-12 PO) Take 1 capsule by mouth every other day.    . Omega-3 Fatty Acids (FISH OIL) 1200 MG CAPS Take 1,200 mg by mouth daily.    . vitamin E 400 UNIT capsule Take 400 Units by mouth daily.     No current facility-administered medications on file prior to visit.     Allergies  Allergen Reactions  . Ibuprofen Anaphylaxis and Swelling  . Nsaids Swelling    "swelling of the throat"    Past Medical History:  Diagnosis Date  . Arthritis   . BPH (benign prostatic hyperplasia)   . History of kidney stones   . HOH (hard of hearing)    Bilateral Hearing Aids  . Hyperlipidemia   . Kidney stones   . Pneumonia    in past    Past Surgical History:  Procedure Laterality Date  . LASER OF PROSTATE W/ GREEN LIGHT PVP  2006  . LITHOTRIPSY    . TONSILLECTOMY     Age 66    Family History  Problem Relation Age of Onset  . Arthritis/Rheumatoid Mother   . Hypertension Mother   . Hypertension Father   . Prostate cancer Father   . Diabetes Brother   . Diabetes Maternal  Grandmother   . Hypertension Paternal Grandmother     Social History   Socioeconomic History  . Marital status: Married    Spouse name: Not on file  . Number of children: Not on file  . Years of education: Not on file  . Highest education level: Not on file  Social Needs  . Financial resource strain: Not on file  . Food insecurity - worry: Not on file  . Food insecurity - inability: Not on file  . Transportation needs - medical: Not on file  . Transportation needs - non-medical: Not on file  Occupational History  . Not on file  Tobacco Use  . Smoking status: Former Smoker    Packs/day: 1.00    Types: Cigarettes    Last attempt to quit: 02/04/1966    Years since quitting: 50.9  . Smokeless tobacco: Never Used  . Tobacco comment: quit in 1967  Substance and Sexual Activity  . Alcohol use: Yes    Alcohol/week: 0.6 oz    Types: 1 Glasses of wine per week    Comment: occassionally.  . Drug use: No  . Sexual activity: Not on file  Other Topics Concern  . Not on file  Social History Narrative  . Not on file   The PMH, PSH, Social History, Family History, Medications, and allergies [facility], and have been updated if relevant.   Review of Systems  Constitutional: Negative.   HENT: Negative.   Eyes: Negative.   Respiratory: Negative.   Cardiovascular: Negative.   Gastrointestinal: Negative.   Endocrine: Negative.   Genitourinary: Negative.   Musculoskeletal: Negative.   Allergic/Immunologic: Negative.   Neurological: Negative.   Psychiatric/Behavioral: Negative.   All other systems reviewed and are negative.      Objective:    BP (!) 144/76 (BP Location: Right Arm, Patient Position: Sitting, Cuff Size: Normal)   Pulse 62   Temp 97.7 F (36.5 C) (Oral)   Ht 5' 6.75" (1.695 m)   Wt 198 lb 12.8 oz (90.2 kg)   SpO2 96%   BMI 31.37 kg/m   BP Readings from Last 3 Encounters:  12/28/16 (!) 144/76  11/24/16 125/64  10/27/16 (!) 146/66      Physical Exam  General:  pleasant male in no acute distress Eyes:  PERRL Ears:  External ear exam shows no significant lesions or deformities.  TMs normal bilaterally Hearing is grossly normal bilaterally. Nose:  External nasal examination shows no deformity or inflammation. Nasal mucosa are pink and moist without lesions or exudates. Mouth:  Oral mucosa and oropharynx without lesions or exudates.  Teeth in good repair. Neck:  no carotid bruit or thyromegaly no cervical or supraclavicular lymphadenopathy  Lungs:  Normal respiratory effort, chest expands symmetrically. Lungs are clear to auscultation, no crackles or wheezes. Heart:  Normal rate and regular rhythm. S1 and S2 normal without gallop, murmur, click, rub or other extra sounds. Abdomen:  Bowel sounds positive,abdomen soft and non-tender without masses, organomegaly or hernias noted. Pulses:  R and L posterior tibial pulses are full and equal bilaterally  Extremities:  no edema  Psych:  Good eye contact, not anxious or depressed appearing       Assessment & Plan:   Essential hypertension  Mixed hyperlipidemia - Plan: Comprehensive metabolic panel, Lipid panel No Follow-up on file.

## 2016-12-28 NOTE — Assessment & Plan Note (Signed)
Due for labs today. Crestor rx refilled.

## 2016-12-28 NOTE — Patient Instructions (Signed)
Great to see you.  We will call you with your lab results and you can view them onilne.

## 2016-12-28 NOTE — Telephone Encounter (Signed)
Cologuard paperwork faxed/copy of order given to pt/thx dmf

## 2016-12-28 NOTE — Assessment & Plan Note (Signed)
Reasonable control without rx. 

## 2017-01-03 ENCOUNTER — Encounter: Payer: Self-pay | Admitting: Family Medicine

## 2017-01-03 LAB — COLOGUARD

## 2017-01-17 ENCOUNTER — Telehealth: Payer: Self-pay | Admitting: Family Medicine

## 2017-01-17 NOTE — Telephone Encounter (Signed)
Colin Smith from [facility]  Cologuard Positive resulted 01/11/17 and will fax copy to [phone]. [facility]

## 2017-01-24 ENCOUNTER — Other Ambulatory Visit: Payer: Self-pay | Admitting: Family Medicine

## 2017-01-24 DIAGNOSIS — R195 Other fecal abnormalities: Secondary | ICD-10-CM

## 2017-01-25 ENCOUNTER — Encounter: Payer: Self-pay | Admitting: Gastroenterology

## 2017-03-15 ENCOUNTER — Ambulatory Visit (INDEPENDENT_AMBULATORY_CARE_PROVIDER_SITE_OTHER): Payer: Medicare Other | Admitting: Gastroenterology

## 2017-03-15 ENCOUNTER — Encounter: Payer: Self-pay | Admitting: Gastroenterology

## 2017-03-15 VITALS — BP 140/80 | HR 72 | Ht 66.0 in | Wt 200.0 lb

## 2017-03-15 DIAGNOSIS — R195 Other fecal abnormalities: Secondary | ICD-10-CM | POA: Diagnosis not present

## 2017-03-15 NOTE — Progress Notes (Signed)
HPI: This is a very pleasant 77 year old man who was referred to me by Dianne DunAron, Talia M, MD  to evaluate positive colon cancer screening test.    Chief complaint is positive Colo guard colon cancer screening test  He is here with his wife today   He underwent Colo guard colon cancer screening test 2 months ago and it was positive.  He has no recent changes in his bowels, no overt GI bleeding, no significant abdominal pains.  Colon cancer does not run in his family.  He has had colonoscopies before.  He believes the most recent was around 2006 and no polyps were found.    Old Data Reviewed:  12/2016 cologuard colon cancer screening test was positive. Labs 06/2016: cbc was normal      Review of systems: Pertinent positive and negative review of systems were noted in the above HPI section. All other review negative.   Past Medical History:  Diagnosis Date  . Arthritis   . BPH (benign prostatic hyperplasia)   . History of kidney stones   . HOH (hard of hearing)    Bilateral Hearing Aids  . Hyperlipidemia   . Kidney stones   . Pneumonia    in past    Past Surgical History:  Procedure Laterality Date  . CATARACT EXTRACTION W/PHACO Left 10/27/2016   Procedure: CATARACT EXTRACTION PHACO AND INTRAOCULAR LENS PLACEMENT (IOC);  Surgeon: [place] CraneKing, Bradley Jarion, MD;  Location: [facility] OR[facility];  Service: Ophthalmology;  Laterality: Left;  Lot #[id number] H US: 00:33.0 AP%:13.2 CDE: 4.37   . CATARACT EXTRACTION W/PHACO Right 11/24/2016   Procedure: CATARACT EXTRACTION PHACO AND INTRAOCULAR LENS PLACEMENT (IOC);  Surgeon: [place] CraneKing, Bradley Tyr, MD;  Location: [facility] OR[facility];  Service: Ophthalmology;  Laterality: Right;  US 00:36.1 AP 16.5% CDE 2.21  . EXAM UNDER ANESTHESIA WITH MANIPULATION OF SHOULDER Right 07/07/2016   Procedure: EXAM UNDER ANESTHESIA WITH MANIPULATION OF SHOULDER;  Surgeon: Christena FlakePoggi, John J, MD;  Location: [facility] OR[facility];  Service: Orthopedics;  Laterality: Right;  . LASER OF PROSTATE W/ GREEN  LIGHT PVP  2006  . LITHOTRIPSY    . RECTAL EXAM UNDER ANESTHESIA  12/03/2014   Procedure: RECTAL EXAM UNDER ANESTHESIA;  Surgeon: Lattie Hawichard E Cooper, MD;  Location: [facility];  Service: General;;  . SHOULDER ARTHROSCOPY WITH DISTAL CLAVICLE RESECTION Right 07/07/2016   Procedure: SHOULDER ARTHROSCOPY WITH DISTAL CLAVICLE RESECTION;  Surgeon: Christena FlakePoggi, John J, MD;  Location: [facility];  Service: Orthopedics;  Laterality: Right;  . SHOULDER ARTHROSCOPY WITH OPEN ROTATOR CUFF REPAIR Right 07/07/2016   Procedure: SHOULDER ARTHROSCOPY WITH OPEN ROTATOR CUFF REPAIR;  Surgeon: Christena FlakePoggi, John J, MD;  Location: [facility];  Service: Orthopedics;  Laterality: Right;  . SHOULDER ARTHROSCOPY WITH SUBACROMIAL DECOMPRESSION, ROTATOR CUFF REPAIR AND BICEP TENDON REPAIR Right 07/07/2016   Procedure: SHOULDER ARTHROSCOPY WITH SUBACROMIAL DECOMPRESSION, ROTATOR CUFF REPAIR AND BICEP TENDON REPAIR;  Surgeon: Christena FlakePoggi, John J, MD;  Location: [facility];  Service: Orthopedics;  Laterality: Right;  Debridement  . TONSILLECTOMY     Age 50    Current Outpatient Medications  Medication Sig Dispense Refill  . acetaminophen (TYLENOL) 500 MG tablet Take 1,000 mg by mouth every 6 (six) hours as needed for mild pain.    . Ascorbic Acid (VITAMIN C) 1000 MG tablet Take 1,000 mg by mouth daily.    . Cholecalciferol (VITAMIN D3) 5000 units TABS Take 5,000 Units by mouth every other day.    . Cyanocobalamin (VITAMIN B-12 PO) Take 1 capsule by mouth every  other day.    . Omega-3 Fatty Acids (FISH OIL) 1200 MG CAPS Take 1,200 mg by mouth daily.    . rosuvastatin (CRESTOR) 10 MG tablet Take 1 tablet (10 mg total) daily by mouth. 90 tablet 3  . vitamin E 400 UNIT capsule Take 400 Units by mouth daily.     No current facility-administered medications for this visit.     Allergies as of 03/15/2017 - Review Complete 03/15/2017  Allergen Reaction Noted  . Ibuprofen Anaphylaxis and Swelling 12/03/2014  . Nsaids Swelling 06/23/2016    Family  History  Problem Relation Age of Onset  . Arthritis/Rheumatoid Mother   . Hypertension Mother   . Hypertension Father   . Prostate cancer Father   . Diabetes Brother   . Diabetes Maternal Grandmother   . Hypertension Paternal Grandmother     Social History   Socioeconomic History  . Marital status: Married    Spouse name: Not on file  . Number of children: Not on file  . Years of education: Not on file  . Highest education level: Not on file  Social Needs  . Financial resource strain: Not on file  . Food insecurity - worry: Not on file  . Food insecurity - inability: Not on file  . Transportation needs - medical: Not on file  . Transportation needs - non-medical: Not on file  Occupational History  . Not on file  Tobacco Use  . Smoking status: Former Smoker    Packs/day: 1.00    Types: Cigarettes    Last attempt to quit: 02/04/1966    Years since quitting: 51.1  . Smokeless tobacco: Never Used  . Tobacco comment: quit in 1967  Substance and Sexual Activity  . Alcohol use: Yes    Alcohol/week: 0.6 oz    Types: 1 Glasses of wine per week    Comment: occassionally.  . Drug use: No  . Sexual activity: Not on file  Other Topics Concern  . Not on file  Social History Narrative  . Not on file     Physical Exam: BP 140/80   Pulse 72   Ht 5\' 6"  (1.676 m)   Wt 200 lb (90.7 kg)   BMI 32.28 kg/m  Constitutional: generally well-appearing Psychiatric: alert and oriented x3 Eyes: extraocular movements intact Mouth: oral pharynx moist, no lesions Neck: supple no lymphadenopathy Cardiovascular: heart regular rate and rhythm Lungs: clear to auscultation bilaterally Abdomen: soft, nontender, nondistended, no obvious ascites, no peritoneal signs, normal bowel sounds Extremities: no lower extremity edema bilaterally Skin: no lesions on visible extremities   Assessment and plan: 77 y.o. male with positive Cologuard colon cancer screening test  I recommended that we  proceed with colonoscopy at his soonest convenience.  We discussed the risks and benefits including bleeding, perforation, missing a cancer and he agreed to proceed.  I see no reason for any further blood tests or imaging studies prior to then.  He had questions about how much he will be responsible for out of his own pocket in terms of cost and I will have our research coordinator call him, she will address his questions as best as possible   Please see the "Patient Instructions" section for addition details about the plan.   Rob Bunting, MD [facility] 03/15/2017, 10:29 AM  Cc: Dianne Dun, MD

## 2017-03-15 NOTE — Patient Instructions (Addendum)
You need a colonoscopy since your cologuard colon cancer screening test was positive.  [place] Precertification Coordinator will contact you about any insurance questions you have reguarding colonoscopy.  Normal BMI (Body Mass Index- based on height and weight) is between 23 and 30. Your BMI today is Body mass index is 32.28 kg/m. Marland Kitchen. Please consider follow up  regarding your BMI with your Primary Care Provider.

## 2017-03-17 ENCOUNTER — Encounter: Payer: Self-pay | Admitting: Gastroenterology

## 2017-05-16 ENCOUNTER — Other Ambulatory Visit: Payer: Self-pay

## 2017-05-16 ENCOUNTER — Ambulatory Visit (AMBULATORY_SURGERY_CENTER): Payer: Self-pay | Admitting: *Deleted

## 2017-05-16 VITALS — Ht 66.5 in | Wt 190.0 lb

## 2017-05-16 DIAGNOSIS — R195 Other fecal abnormalities: Secondary | ICD-10-CM

## 2017-05-16 NOTE — Progress Notes (Signed)
No egg or soy allergy known to patient  No issues with past sedation with any surgeries  or procedures, no intubation problems  No diet pills per patient No home 02 use per patient  No blood thinners per patient  Pt denies issues with constipation  No A fib or A flutter  EMMI video sent to pt's e mail pt declined   

## 2017-05-18 ENCOUNTER — Telehealth: Payer: Self-pay | Admitting: Gastroenterology

## 2017-05-18 DIAGNOSIS — R195 Other fecal abnormalities: Secondary | ICD-10-CM

## 2017-05-18 MED ORDER — PEG 3350-KCL-NA BICARB-NACL 420 G PO SOLR: 4000.0000 mL | Freq: Once | ORAL | 0 refills | Status: AC

## 2017-05-18 NOTE — Telephone Encounter (Signed)
Patient wife states prep for colon on 4.9.19 is not at [facility]. Pt had pv on 3.26.19 and based on letter he is doing the golytely prep.

## 2017-05-18 NOTE — Telephone Encounter (Signed)
Rx for Golytely sent to pharmacy. [facility] and notified of same.

## 2017-05-30 ENCOUNTER — Other Ambulatory Visit: Payer: Self-pay

## 2017-05-30 ENCOUNTER — Ambulatory Visit (AMBULATORY_SURGERY_CENTER): Payer: Medicare Other | Admitting: Gastroenterology

## 2017-05-30 ENCOUNTER — Encounter: Payer: Self-pay | Admitting: Gastroenterology

## 2017-05-30 VITALS — BP 138/78 | HR 52 | Temp 98.2°F | Resp 8 | Ht 66.0 in | Wt 200.0 lb

## 2017-05-30 DIAGNOSIS — K649 Unspecified hemorrhoids: Secondary | ICD-10-CM

## 2017-05-30 DIAGNOSIS — R195 Other fecal abnormalities: Secondary | ICD-10-CM | POA: Diagnosis present

## 2017-05-30 MED ORDER — SODIUM CHLORIDE 0.9 % IV SOLN: 500.0000 mL | INTRAVENOUS | Status: DC

## 2017-05-30 NOTE — Progress Notes (Signed)
To PACU, VSS. Report to RN.tb 

## 2017-05-30 NOTE — Op Note (Signed)
[facility] Patient Name: Colin RoughMark Fly Procedure Date: 05/30/2017 8:22 AM MRN: [id number] Endoscopist: Rachael Feeaniel P Jacobs , MD Age: 77 Referring MD:  Date of Birth: 12/18/1940 Gender: Male Account #: [id number] Procedure:                Colonoscopy Indications:              Positive Cologuard test Medicines:                Monitored Anesthesia Care Procedure:                Pre-Anesthesia Assessment:                           - Prior to the procedure, a History and Physical                            was performed, and patient medications and                            allergies were reviewed. The patient's tolerance of                            previous anesthesia was also reviewed. The risks                            and benefits of the procedure and the sedation                            options and risks were discussed with the patient.                            All questions were answered, and informed consent                            was obtained. Prior Anticoagulants: The patient has                            taken no previous anticoagulant or antiplatelet                            agents. ASA Grade Assessment: II - A patient with                            mild systemic disease. After reviewing the risks                            and benefits, the patient was deemed in                            satisfactory condition to undergo the procedure.                           After obtaining informed consent, the colonoscope  was passed under direct vision. Throughout the                            procedure, the patient's blood pressure, pulse, and                            oxygen saturations were monitored continuously. The                            Colonoscope was introduced through the anus and                            advanced to the the cecum, identified by                            appendiceal orifice and ileocecal valve. The                        colonoscopy was performed without difficulty. The                            patient tolerated the procedure well. The quality                            of the bowel preparation was good. The ileocecal                            valve, appendiceal orifice, and rectum were                            photographed. Scope In: 8:34:56 AM Scope Out: 8:45:15 AM Scope Withdrawal Time: 0 hours 7 minutes 33 seconds  Total Procedure Duration: 0 hours 10 minutes 19 seconds  Findings:                 External and internal hemorrhoids were found. The                            hemorrhoids were small.                           The exam was otherwise without abnormality on                            direct and retroflexion views. Complications:            No immediate complications. Estimated blood loss:                            None. Estimated Blood Loss:     Estimated blood loss: none. Impression:               - External and internal hemorrhoids.                           - The examination was otherwise normal on direct  and retroflexion views.                           - No polyps or cancers. Recommendation:           - Patient has a contact number available for                            emergencies. The signs and symptoms of potential                            delayed complications were discussed with the                            patient. Return to normal activities tomorrow.                            Written discharge instructions were provided to the                            patient.                           - Resume previous diet.                           - Continue present medications.                           You do not need any further colon cancer screening                            tests (including stool testing). These types of                            tests generally stop around age 36-80. Rachael Fee, MD 05/30/2017  8:48:54 AM This report has been signed electronically.

## 2017-05-30 NOTE — Patient Instructions (Signed)
YOU HAD AN ENDOSCOPIC PROCEDURE TODAY AT THE [facility]:   Refer to the procedure report that was given to you for any specific questions about what was found during the examination.  If the procedure report does not answer your questions, please call your gastroenterologist to clarify.  If you requested that your care partner not be given the details of your procedure findings, then the procedure report has been included in a sealed envelope for you to review at your convenience later.  YOU SHOULD EXPECT: Some feelings of bloating in the abdomen. Passage of more gas than usual.  Walking can help get rid of the air that was put into your GI tract during the procedure and reduce the bloating. If you had a lower endoscopy (such as a colonoscopy or flexible sigmoidoscopy) you may notice spotting of blood in your stool or on the toilet paper. If you underwent a bowel prep for your procedure, you may not have a normal bowel movement for a few days.  Please Note:  You might notice some irritation and congestion in your nose or some drainage.  This is from the oxygen used during your procedure.  There is no need for concern and it should clear up in a day or so.  SYMPTOMS TO REPORT IMMEDIATELY:   Following lower endoscopy (colonoscopy or flexible sigmoidoscopy):  Excessive amounts of blood in the stool  Significant tenderness or worsening of abdominal pains  Swelling of the abdomen that is new, acute  Fever of 100F or higher   For urgent or emergent issues, a gastroenterologist can be reached at any hour by calling [phone].   DIET:  We do recommend a small meal at first, but then you may proceed to your regular diet.  Drink plenty of fluids but you should avoid alcoholic beverages for 24 hours.  ACTIVITY:  You should plan to take it easy for the rest of today and you should NOT DRIVE or use heavy machinery until tomorrow (because of the sedation medicines used during the test).     FOLLOW UP: Our staff will call the number listed on your records the next business day following your procedure to check on you and address any questions or concerns that you may have regarding the information given to you following your procedure. If we do not reach you, we will leave a message.  However, if you are feeling well and you are not experiencing any problems, there is no need to return our call.  We will assume that you have returned to your regular daily activities without incident.  If any biopsies were taken you will be contacted by phone or by letter within the next 1-3 weeks.  Please call us at [phone] if you have not heard about the biopsies in 3 weeks.    SIGNATURES/CONFIDENTIALITY: You and/or your care partner have signed paperwork which will be entered into your electronic medical record.  These signatures attest to the fact that that the information above on your After Visit Summary has been reviewed and is understood.  Full responsibility of the confidentiality of this discharge information lies with you and/or your care-partner.  Read all handouts given to you by your recovery room nurse. 

## 2017-05-31 ENCOUNTER — Telehealth: Payer: Self-pay

## 2017-05-31 NOTE — Telephone Encounter (Signed)
  Follow up Call-  Call back number 05/30/2017  Post procedure Call Back phone  # [phone]  Permission to leave phone message Yes  Some recent data might be hidden     Patient questions:  Do you have a fever, pain , or abdominal swelling? No. Pain Score  0 *  Have you tolerated food without any problems? Yes.    Have you been able to return to your normal activities? Yes.    Do you have any questions about your discharge instructions: Diet   No. Medications  No. Follow up visit  No.  Do you have questions or concerns about your Care? No.  Actions: * If pain score is 4 or above: No action needed, pain <4.

## 2017-08-28 ENCOUNTER — Encounter: Payer: Self-pay | Admitting: Family Medicine

## 2017-08-28 ENCOUNTER — Ambulatory Visit (INDEPENDENT_AMBULATORY_CARE_PROVIDER_SITE_OTHER): Payer: Medicare Other | Admitting: Family Medicine

## 2017-08-28 VITALS — BP 116/78 | HR 60 | Temp 97.7°F | Ht 67.0 in | Wt 194.0 lb

## 2017-08-28 DIAGNOSIS — R195 Other fecal abnormalities: Secondary | ICD-10-CM

## 2017-08-28 DIAGNOSIS — E782 Mixed hyperlipidemia: Secondary | ICD-10-CM | POA: Diagnosis not present

## 2017-08-28 DIAGNOSIS — Z125 Encounter for screening for malignant neoplasm of prostate: Secondary | ICD-10-CM | POA: Diagnosis not present

## 2017-08-28 DIAGNOSIS — I10 Essential (primary) hypertension: Secondary | ICD-10-CM | POA: Diagnosis not present

## 2017-08-28 MED ORDER — ROSUVASTATIN CALCIUM 10 MG PO TABS: 10.0000 mg | ORAL_TABLET | Freq: Every day | ORAL | 3 refills | Status: AC

## 2017-08-28 NOTE — Progress Notes (Signed)
Subjective:   Patient ID: Colin Smith, male    DOB: 08/31/1940, 77 y.o.   MRN: [id number]  Colin SchaumannMark L Cawood is a pleasant 77 y.o. year old male who presents to clinic today with Follow-up (Patient is here today for a F/U.  He is currently fasting.  Colonoscopy was completed on 4.9.19 after a positive Cologuard screening and findings were small external and internal hemorrhoids with otherwise normal findings.  He is not in need of his Crestor refill as of now.  He wears hearing aids.  Had Bilateral Cateract Surgery Aug 2018 then an eye exam October 2018.)  on 08/28/2017  HPI:  Patient is here today for a F/U. He is currently fasting. Colonoscopy was completed on 4.9.19 after a positive Cologuard screening -  findings were small external and internal hemorrhoids with otherwise normal findings.   Has not yet had his wellness visit with Lawanna KobusAngel, Charity fundraiserN.   He wears hearing aids. Had Bilateral Cateract Surgery Aug 2018 then an eye exam October 2018  HLD- on crestor 10 mg daily.  Lab Results  Component Value Date   CHOL 125 12/28/2016   HDL 37.40 (L) 12/28/2016   LDLCALC 64 12/28/2016   TRIG 118.0 12/28/2016   CHOLHDL 3 12/28/2016     Current Outpatient Medications on File Prior to Visit  Medication Sig Dispense Refill  . acetaminophen (TYLENOL) 500 MG tablet Take 1,000 mg by mouth every 6 (six) hours as needed for mild pain.    . Ascorbic Acid (VITAMIN C) 1000 MG tablet Take 1,000 mg by mouth daily.    . Cholecalciferol (VITAMIN D3) 5000 units TABS Take 5,000 Units by mouth every other day.    . Cyanocobalamin (VITAMIN B-12 PO) Take 1 capsule by mouth every other day.    . vitamin E 400 UNIT capsule Take 400 Units by mouth daily.     No current facility-administered medications on file prior to visit.     Allergies  Allergen Reactions  . Ibuprofen Anaphylaxis and Swelling  . Nsaids Swelling    "swelling of the throat"    Past Medical History:  Diagnosis Date  . Allergy   . BPH  (benign prostatic hyperplasia)   . Cataract    both eyes   corrected in 2018  . History of kidney stones   . HOH (hard of hearing)    Bilateral Hearing Aids  . Hyperlipidemia   . Kidney stones   . Pneumonia    in past    Past Surgical History:  Procedure Laterality Date  . CATARACT EXTRACTION W/PHACO Left 10/27/2016   Procedure: CATARACT EXTRACTION PHACO AND INTRAOCULAR LENS PLACEMENT (IOC);  Surgeon: Nevada CraneKing, Bradley Summit, MD;  Location: [facility];  Service: Ophthalmology;  Laterality: Left;  Lot #[id number] H US: 00:33.0 AP%:13.2 CDE: 4.37   . CATARACT EXTRACTION W/PHACO Right 11/24/2016   Procedure: CATARACT EXTRACTION PHACO AND INTRAOCULAR LENS PLACEMENT (IOC);  Surgeon: Nevada CraneKing, Bradley Bailen, MD;  Location: [facility];  Service: Ophthalmology;  Laterality: Right;  US 00:36.1 AP 16.5% CDE 2.21  . COLONOSCOPY    . EXAM UNDER ANESTHESIA WITH MANIPULATION OF SHOULDER Right 07/07/2016   Procedure: EXAM UNDER ANESTHESIA WITH MANIPULATION OF SHOULDER;  Surgeon: Christena FlakePoggi, John J, MD;  Location: [facility];  Service: Orthopedics;  Laterality: Right;  . LASER OF PROSTATE W/ GREEN LIGHT PVP  2006  . LITHOTRIPSY    . RECTAL EXAM UNDER ANESTHESIA  12/03/2014   Procedure: RECTAL EXAM UNDER ANESTHESIA;  Surgeon: Lattie Hawichard E Cooper,  MD;  Location: [facility];  Service: General;;  . SHOULDER ARTHROSCOPY WITH DISTAL CLAVICLE RESECTION Right 07/07/2016   Procedure: SHOULDER ARTHROSCOPY WITH DISTAL CLAVICLE RESECTION;  Surgeon: Christena Flake, MD;  Location: [facility];  Service: Orthopedics;  Laterality: Right;  . SHOULDER ARTHROSCOPY WITH OPEN ROTATOR CUFF REPAIR Right 07/07/2016   Procedure: SHOULDER ARTHROSCOPY WITH OPEN ROTATOR CUFF REPAIR;  Surgeon: Christena Flake, MD;  Location: [facility];  Service: Orthopedics;  Laterality: Right;  . SHOULDER ARTHROSCOPY WITH SUBACROMIAL DECOMPRESSION, ROTATOR CUFF REPAIR AND BICEP TENDON REPAIR Right 07/07/2016   Procedure: SHOULDER ARTHROSCOPY WITH SUBACROMIAL DECOMPRESSION, ROTATOR  CUFF REPAIR AND BICEP TENDON REPAIR;  Surgeon: Christena Flake, MD;  Location: [facility];  Service: Orthopedics;  Laterality: Right;  Debridement  . TONSILLECTOMY     Age 2    Family History  Problem Relation Age of Onset  . Arthritis/Rheumatoid Mother   . Hypertension Mother   . Hypertension Father   . Prostate cancer Father   . Diabetes Brother   . Diabetes Maternal Grandmother   . Hypertension Paternal Grandmother   . Colon cancer Neg Hx   . Colon polyps Neg Hx   . Esophageal cancer Neg Hx   . Rectal cancer Neg Hx   . Stomach cancer Neg Hx     Social History   Socioeconomic History  . Marital status: Married    Spouse name: Not on file  . Number of children: Not on file  . Years of education: Not on file  . Highest education level: Not on file  Occupational History  . Not on file  Social Needs  . Financial resource strain: Not on file  . Food insecurity:    Worry: Not on file    Inability: Not on file  . Transportation needs:    Medical: Not on file    Non-medical: Not on file  Tobacco Use  . Smoking status: Former Smoker    Packs/day: 1.00    Types: Cigarettes    Last attempt to quit: 02/04/1966    Years since quitting: 51.5  . Smokeless tobacco: Never Used  . Tobacco comment: quit in 1967  Substance and Sexual Activity  . Alcohol use: Yes    Alcohol/week: 0.6 oz    Types: 1 Glasses of wine per week    Comment: occassionally.  . Drug use: No  . Sexual activity: Not on file  Lifestyle  . Physical activity:    Days per week: Not on file    Minutes per session: Not on file  . Stress: Not on file  Relationships  . Social connections:    Talks on phone: Not on file    Gets together: Not on file    Attends religious service: Not on file    Active member of club or organization: Not on file    Attends meetings of clubs or organizations: Not on file    Relationship status: Not on file  . Intimate partner violence:    Fear of current or ex partner: Not on  file    Emotionally abused: Not on file    Physically abused: Not on file    Forced sexual activity: Not on file  Other Topics Concern  . Not on file  Social History Narrative  . Not on file   The PMH, PSH, Social History, Family History, Medications, and allergies have been reviewed in [facility] and have been updated if relevant.   Review of Systems  Constitutional: Negative.  HENT: Negative.   Eyes: Negative.   Respiratory: Negative.   Cardiovascular: Negative.   Gastrointestinal: Negative.   Genitourinary: Negative.   Musculoskeletal: Negative.   Allergic/Immunologic: Negative.   Neurological: Negative.   Hematological: Negative.   Psychiatric/Behavioral: Negative.   All other systems reviewed and are negative.      Objective:    BP 116/78 (BP Location: Left Arm, Patient Position: Sitting, Cuff Size: Normal)   Pulse 60   Temp 97.7 F (36.5 C) (Oral)   Ht 5\' 7"  (1.702 m)   Wt 194 lb (88 kg)   SpO2 96%   BMI 30.38 kg/m    Physical Exam   General:  pleasant male in no acute distress Eyes:  PERRL Ears:  External ear exam shows no significant lesions or deformities.  TMs normal bilaterally Hearing is grossly normal bilaterally. Nose:  External nasal examination shows no deformity or inflammation. Nasal mucosa are pink and moist without lesions or exudates. Mouth:  Oral mucosa and oropharynx without lesions or exudates.  Teeth in good repair. Neck:  no carotid bruit or thyromegaly no cervical or supraclavicular lymphadenopathy  Lungs:  Normal respiratory effort, chest expands symmetrically. Lungs are clear to auscultation, no crackles or wheezes. Heart:  Normal rate and regular rhythm. S1 and S2 normal without gallop, murmur, click, rub or other extra sounds. Abdomen:  Bowel sounds positive,abdomen soft and non-tender without masses, organomegaly or hernias noted. Pulses:  R and L posterior tibial pulses are full and equal bilaterally  Extremities:  no edema  Psych:   Good eye contact, not anxious or depressed appearing      Assessment & Plan:   Mixed hyperlipidemia - Plan: Lipid panel, Comprehensive metabolic panel  Essential hypertension  Screening for malignant neoplasm of prostate - Plan: PSA No follow-ups on file.

## 2017-08-28 NOTE — Assessment & Plan Note (Signed)
Refill statin today. Check labs. The patient indicates understanding of these issues and agrees with the plan. Orders Placed This Encounter  Procedures  . Comprehensive metabolic panel  . Lipid panel  . PSA, Medicare

## 2017-08-28 NOTE — Patient Instructions (Addendum)
Great to see you. I will call you with your lab results from today and you can view them online.   Congratulations and good luck with your move!

## 2017-08-28 NOTE — Assessment & Plan Note (Signed)
Well controlled without rx. 

## 2017-08-28 NOTE — Assessment & Plan Note (Signed)
Neg colonoscopy in 05/2017. Report reviewed. No longer needs further colon CA screening per GI.

## 2017-08-28 NOTE — Addendum Note (Signed)
Addended by: Lerry LinerFREDERICK, Joette Schmoker. M on: 08/28/2017 10:44 AM   Modules accepted: Orders

## 2017-09-04 ENCOUNTER — Other Ambulatory Visit (INDEPENDENT_AMBULATORY_CARE_PROVIDER_SITE_OTHER): Payer: Medicare Other

## 2017-09-04 DIAGNOSIS — Z125 Encounter for screening for malignant neoplasm of prostate: Secondary | ICD-10-CM | POA: Diagnosis not present

## 2017-09-04 DIAGNOSIS — E782 Mixed hyperlipidemia: Secondary | ICD-10-CM | POA: Diagnosis not present

## 2017-09-04 LAB — LIPID PANEL
Cholesterol: 177 mg/dL (ref 0–200)
HDL: 44.4 mg/dL (ref >39.00)
LDL CALC: 111 mg/dL — AB (ref 0–99)
NonHDL: 132.35
Total CHOL/HDL Ratio: 4
Triglycerides: 108 mg/dL (ref 0.0–149.0)
VLDL: 21.6 mg/dL (ref 0.0–40.0)

## 2017-09-04 LAB — COMPREHENSIVE METABOLIC PANEL
ALBUMIN: 3.8 g/dL (ref 3.5–5.2)
ALT: 22 U/L (ref 0–53)
AST: 17 U/L (ref 0–37)
Alkaline Phosphatase: 98 U/L (ref 39–117)
BUN: 23 mg/dL (ref 6–23)
CHLORIDE: 104 meq/L (ref 96–112)
CO2: 30 mEq/L (ref 19–32)
CREATININE: 1.06 mg/dL (ref 0.40–1.50)
Calcium: 8.6 mg/dL (ref 8.4–10.5)
GFR: 71.61 mL/min (ref >60.00)
Glucose, Bld: 122 mg/dL — ABNORMAL HIGH (ref 70–99)
POTASSIUM: 4.8 meq/L (ref 3.5–5.1)
SODIUM: 140 meq/L (ref 135–145)
TOTAL PROTEIN: 6.3 g/dL (ref 6.0–8.3)
Total Bilirubin: 0.5 mg/dL (ref 0.2–1.2)

## 2017-09-04 LAB — PSA, MEDICARE: PSA: 3.09 ng/mL (ref 0.10–4.00)

## 2018-07-23 IMAGING — MR MR SHOULDER*R* W/O CM
5 series · 40 of 40 positions shown · non-contrast
Comparison: None.

CLINICAL DATA: Right shoulder pain radiating into the arm and
fingers. Decreased range of motion. No known injury.

EXAM:
MRI OF THE RIGHT SHOULDER WITHOUT CONTRAST
TECHNIQUE: Multiplanar, multisequence MR imaging of the shoulder was performed.
No intravenous contrast was administered.

[Series 3: T2 fat-sat · axial · 4.0mm · 0.47mm/px · z∈[-35,+62]mm · 8 of 23 slices shown (1 of 3)]
[im 1/23]
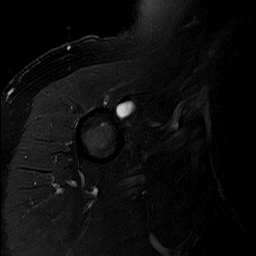
[im 4/23]
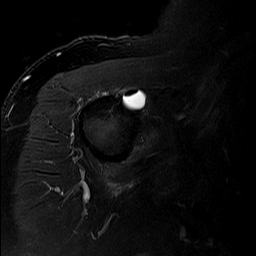
[im 7/23]
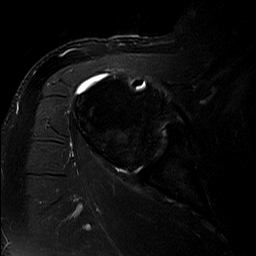
[im 10/23]
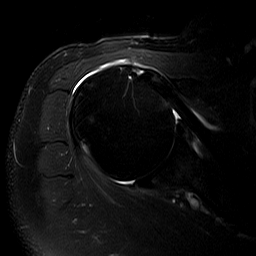
[im 13/23]
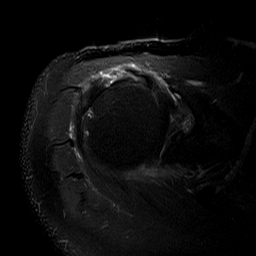
[im 16/23]
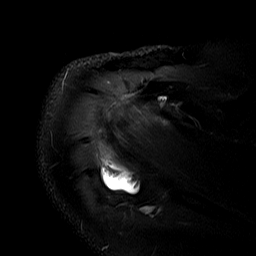
[im 19/23]
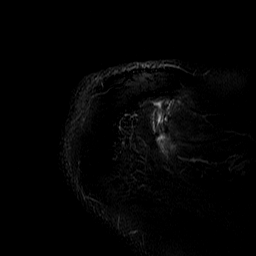
[im 23/23]
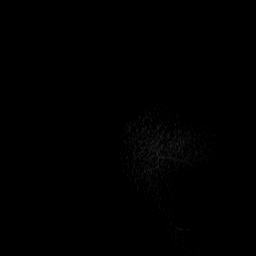

[Series 4: T2 fat-sat · coronal · 4.0mm · 0.62mm/px · 8 of 21 slices shown (2 of 3)]
[im 1/21]
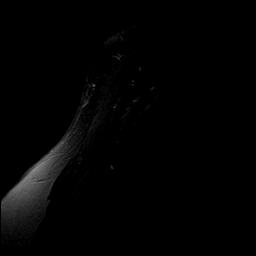
[im 3/21]
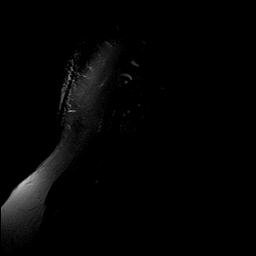
[im 6/21]
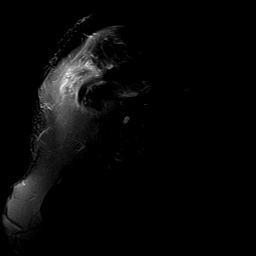
[im 9/21]
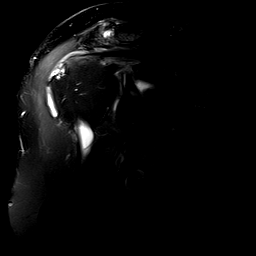
[im 12/21]
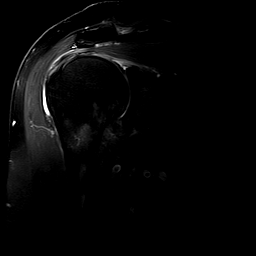
[im 15/21]
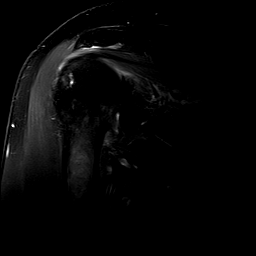
[im 18/21]
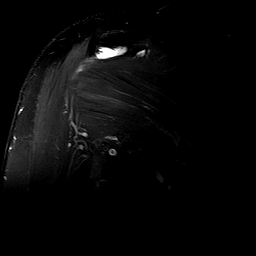
[im 21/21]
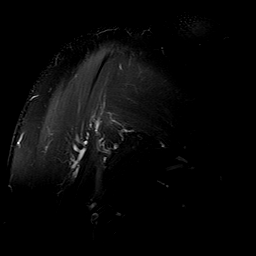

[Series 5: PD · coronal · 4.0mm · 0.62mm/px · 8 of 21 slices shown]
[im 1/21]
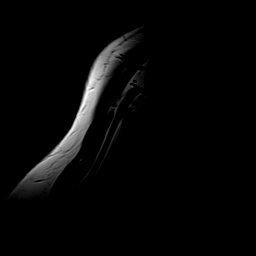
[im 3/21]
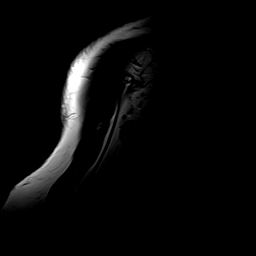
[im 6/21]
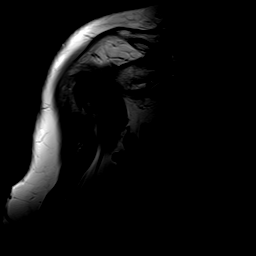
[im 9/21]
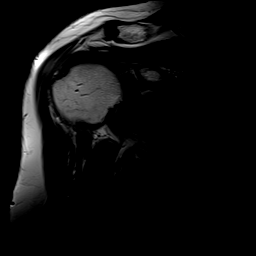
[im 12/21]
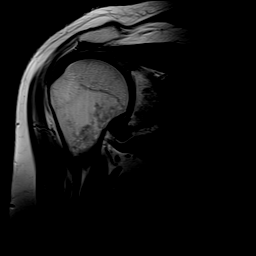
[im 15/21]
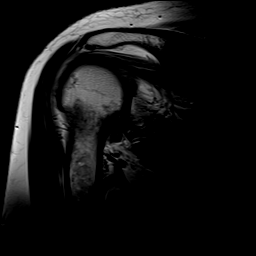
[im 18/21]
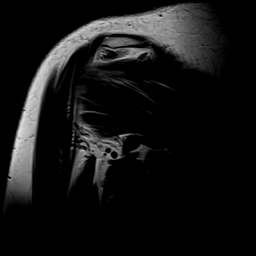
[im 21/21]
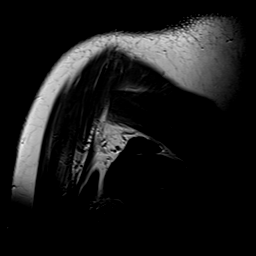

[Series 6: T1 · sagittal · 4.0mm · 0.62mm/px · 8 of 23 slices shown]
[im 1/23]
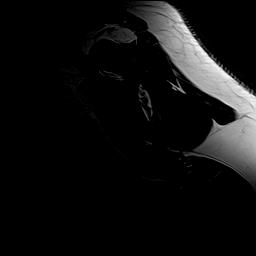
[im 4/23]
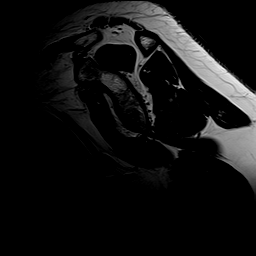
[im 7/23]
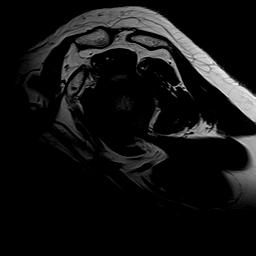
[im 10/23]
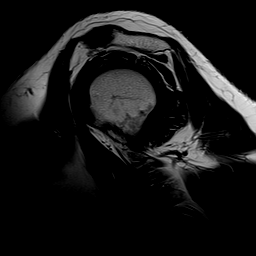
[im 13/23]
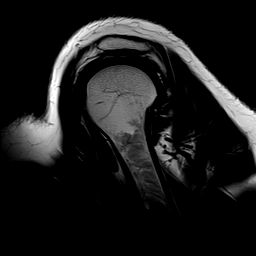
[im 16/23]
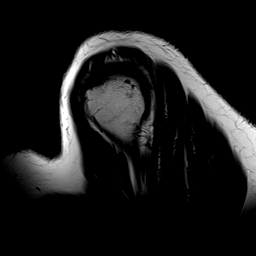
[im 19/23]
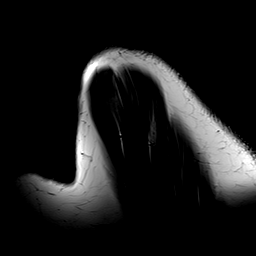
[im 23/23]
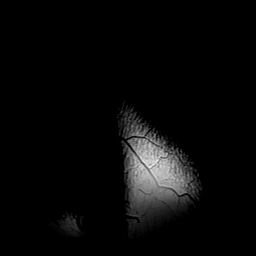

[Series 7: T2 fat-sat · sagittal · 4.0mm · 0.62mm/px · 8 of 23 slices shown (3 of 3)]
[im 1/23]
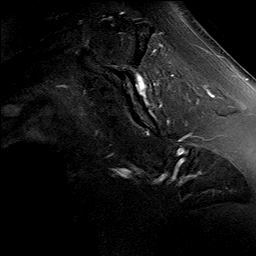
[im 4/23]
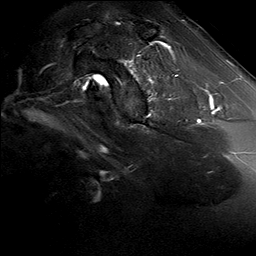
[im 7/23]
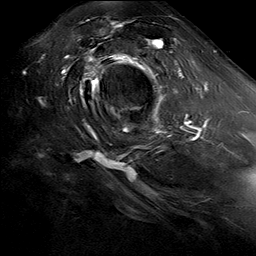
[im 10/23]
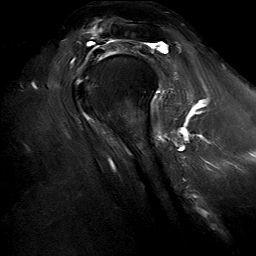
[im 13/23]
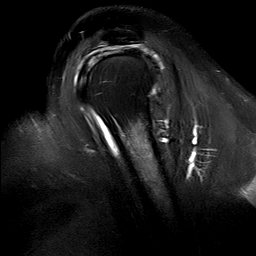
[im 16/23]
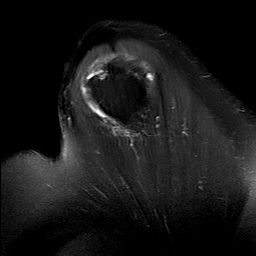
[im 19/23]
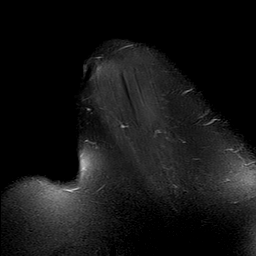
[im 23/23]
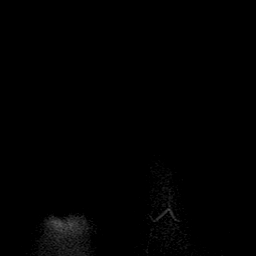

[40 of 40 positions shown; findings below may reference images not displayed]

FINDINGS: Rotator cuff: There is supraspinatus worse than infraspinatus and
subscapularis tendinopathy. A partial width tear of the anterior and
far lateral supraspinatus measures approximately 0.8 cm from front
to back. The tear is nearly full-thickness but there may be a thin
band of articular sided fibers which are intact. There is little to
no retraction.

Muscles:  No atrophy or focal lesion.

Biceps long head:  Intact.

Acromioclavicular Joint: Moderate to moderately severe degenerative
change is seen. Type 1 acromion. Prominent fluid is present in the
subacromial/subdeltoid bursa.

Glenohumeral Joint: Thickening and intermediate increased T2 signal
in the inferior glenohumeral ligament and mild edema in the
bicipital interval consistent with adhesive capsulitis are
identified. No notable degenerative disease about the joint.

Labrum: Intrasubstance increased T2 signal is seen in the superior
labrum consistent with degeneration and tearing. The labrum is
markedly diminutive. The tear extends from approximately the level
of the biceps tendon attachment posteriorly to the 11 o'clock
position.

Bones:  No fracture or worrisome lesion.

Other: None.
IMPRESSION: Rotator cuff tendinopathy with a partial width tear of the
supraspinatus measuring 0.8 cm from front to back. There is no
retraction or atrophy. The tear is near full-thickness. There may be
a thin band of articular sided fibers which are intact.

Intrasubstance increased T2 signal in the superior labrum consistent
with tear.

Findings consistent with adhesive capsulitis.

Moderate to moderately severe acromioclavicular osteoarthritis.

Subacromial/subdeltoid bursitis.
